# Patient Record
Sex: Female | Born: 1980 | Hispanic: No | Marital: Single | Smoking: Never smoker
Health system: Southern US, Community
[De-identification: ages and names within clinical notes are randomized; demographics above are authoritative.]

## PROBLEM LIST (undated history)

## (undated) ENCOUNTER — Inpatient Hospital Stay (HOSPITAL_COMMUNITY): Payer: Self-pay

## (undated) DIAGNOSIS — F32A Depression, unspecified: Secondary | ICD-10-CM

## (undated) DIAGNOSIS — R569 Unspecified convulsions: Secondary | ICD-10-CM

## (undated) DIAGNOSIS — O09219 Supervision of pregnancy with history of pre-term labor, unspecified trimester: Principal | ICD-10-CM

## (undated) DIAGNOSIS — F329 Major depressive disorder, single episode, unspecified: Secondary | ICD-10-CM

## (undated) DIAGNOSIS — Z789 Other specified health status: Secondary | ICD-10-CM

## (undated) DIAGNOSIS — D649 Anemia, unspecified: Secondary | ICD-10-CM

## (undated) HISTORY — DX: Major depressive disorder, single episode, unspecified: F32.9

## (undated) HISTORY — PX: BREAST SURGERY: SHX581

## (undated) HISTORY — DX: Depression, unspecified: F32.A

## (undated) HISTORY — DX: Supervision of pregnancy with history of pre-term labor, unspecified trimester: O09.219

---
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
## 1998-09-20 ENCOUNTER — Inpatient Hospital Stay (HOSPITAL_COMMUNITY): Admission: AD | Admit: 1998-09-20 | Discharge: 1998-09-20 | Payer: Self-pay | Admitting: Obstetrics & Gynecology

## 1998-09-20 ENCOUNTER — Encounter: Payer: Self-pay | Admitting: Obstetrics & Gynecology

## 1999-04-01 ENCOUNTER — Encounter (HOSPITAL_BASED_OUTPATIENT_CLINIC_OR_DEPARTMENT_OTHER): Payer: Self-pay | Admitting: General Surgery

## 1999-04-01 ENCOUNTER — Ambulatory Visit (HOSPITAL_COMMUNITY): Admission: RE | Admit: 1999-04-01 | Discharge: 1999-04-01 | Payer: Self-pay | Admitting: General Surgery

## 1999-05-12 ENCOUNTER — Ambulatory Visit (HOSPITAL_BASED_OUTPATIENT_CLINIC_OR_DEPARTMENT_OTHER): Admission: RE | Admit: 1999-05-12 | Discharge: 1999-05-12 | Payer: Self-pay | Admitting: General Surgery

## 1999-10-27 ENCOUNTER — Emergency Department (HOSPITAL_COMMUNITY): Admission: EM | Admit: 1999-10-27 | Discharge: 1999-10-27 | Payer: Self-pay | Admitting: Emergency Medicine

## 2000-02-03 ENCOUNTER — Ambulatory Visit (HOSPITAL_COMMUNITY): Admission: AD | Admit: 2000-02-03 | Discharge: 2000-02-03 | Payer: Self-pay | Admitting: Obstetrics

## 2000-11-16 ENCOUNTER — Emergency Department (HOSPITAL_COMMUNITY): Admission: EM | Admit: 2000-11-16 | Discharge: 2000-11-16 | Payer: Self-pay | Admitting: Emergency Medicine

## 2000-11-18 ENCOUNTER — Inpatient Hospital Stay (HOSPITAL_COMMUNITY): Admission: AD | Admit: 2000-11-18 | Discharge: 2000-11-18 | Payer: Self-pay | Admitting: Obstetrics and Gynecology

## 2000-11-25 ENCOUNTER — Inpatient Hospital Stay (HOSPITAL_COMMUNITY): Admission: AD | Admit: 2000-11-25 | Discharge: 2000-11-25 | Payer: Self-pay | Admitting: Obstetrics and Gynecology

## 2001-03-31 ENCOUNTER — Encounter: Admission: RE | Admit: 2001-03-31 | Discharge: 2001-03-31 | Payer: Self-pay | Admitting: Family Medicine

## 2001-04-21 ENCOUNTER — Encounter (INDEPENDENT_AMBULATORY_CARE_PROVIDER_SITE_OTHER): Payer: Self-pay | Admitting: *Deleted

## 2001-04-21 LAB — CONVERTED CEMR LAB

## 2001-05-03 ENCOUNTER — Inpatient Hospital Stay (HOSPITAL_COMMUNITY): Admission: RE | Admit: 2001-05-03 | Discharge: 2001-05-03 | Payer: Self-pay

## 2001-05-05 ENCOUNTER — Inpatient Hospital Stay (HOSPITAL_COMMUNITY): Admission: AD | Admit: 2001-05-05 | Discharge: 2001-05-05 | Payer: Self-pay | Admitting: *Deleted

## 2001-05-05 ENCOUNTER — Encounter: Admission: RE | Admit: 2001-05-05 | Discharge: 2001-05-05 | Payer: Self-pay | Admitting: Family Medicine

## 2001-05-23 ENCOUNTER — Encounter: Admission: RE | Admit: 2001-05-23 | Discharge: 2001-05-23 | Payer: Self-pay | Admitting: Family Medicine

## 2001-05-26 ENCOUNTER — Inpatient Hospital Stay (HOSPITAL_COMMUNITY): Admission: RE | Admit: 2001-05-26 | Discharge: 2001-05-26 | Payer: Self-pay | Admitting: Family Medicine

## 2001-06-02 ENCOUNTER — Encounter: Admission: RE | Admit: 2001-06-02 | Discharge: 2001-06-02 | Payer: Self-pay | Admitting: Family Medicine

## 2001-06-06 ENCOUNTER — Encounter: Admission: RE | Admit: 2001-06-06 | Discharge: 2001-06-06 | Payer: Self-pay | Admitting: Obstetrics & Gynecology

## 2001-06-22 ENCOUNTER — Encounter: Admission: RE | Admit: 2001-06-22 | Discharge: 2001-06-22 | Payer: Self-pay | Admitting: Obstetrics

## 2001-08-12 ENCOUNTER — Emergency Department (HOSPITAL_COMMUNITY): Admission: EM | Admit: 2001-08-12 | Discharge: 2001-08-12 | Payer: Self-pay | Admitting: Emergency Medicine

## 2001-09-29 ENCOUNTER — Inpatient Hospital Stay (HOSPITAL_COMMUNITY): Admission: AD | Admit: 2001-09-29 | Discharge: 2001-09-29 | Payer: Self-pay | Admitting: *Deleted

## 2001-10-02 ENCOUNTER — Encounter (INDEPENDENT_AMBULATORY_CARE_PROVIDER_SITE_OTHER): Payer: Self-pay

## 2001-10-02 ENCOUNTER — Inpatient Hospital Stay (HOSPITAL_COMMUNITY): Admission: AD | Admit: 2001-10-02 | Discharge: 2001-10-02 | Payer: Self-pay | Admitting: *Deleted

## 2001-10-02 ENCOUNTER — Ambulatory Visit (HOSPITAL_COMMUNITY): Admission: RE | Admit: 2001-10-02 | Discharge: 2001-10-02 | Payer: Self-pay | Admitting: Obstetrics and Gynecology

## 2002-06-06 ENCOUNTER — Inpatient Hospital Stay (HOSPITAL_COMMUNITY): Admission: AD | Admit: 2002-06-06 | Discharge: 2002-06-06 | Payer: Self-pay | Admitting: Obstetrics and Gynecology

## 2002-06-11 ENCOUNTER — Inpatient Hospital Stay (HOSPITAL_COMMUNITY): Admission: AD | Admit: 2002-06-11 | Discharge: 2002-06-11 | Payer: Self-pay | Admitting: *Deleted

## 2002-11-12 ENCOUNTER — Encounter: Payer: Self-pay | Admitting: Obstetrics and Gynecology

## 2002-11-12 ENCOUNTER — Inpatient Hospital Stay (HOSPITAL_COMMUNITY): Admission: AD | Admit: 2002-11-12 | Discharge: 2002-11-12 | Payer: Self-pay | Admitting: Obstetrics and Gynecology

## 2002-11-14 ENCOUNTER — Inpatient Hospital Stay (HOSPITAL_COMMUNITY): Admission: AD | Admit: 2002-11-14 | Discharge: 2002-11-14 | Payer: Self-pay | Admitting: Obstetrics and Gynecology

## 2002-12-05 ENCOUNTER — Other Ambulatory Visit: Admission: RE | Admit: 2002-12-05 | Discharge: 2002-12-05 | Payer: Self-pay | Admitting: *Deleted

## 2003-03-14 ENCOUNTER — Inpatient Hospital Stay (HOSPITAL_COMMUNITY): Admission: AD | Admit: 2003-03-14 | Discharge: 2003-03-14 | Payer: Self-pay | Admitting: Obstetrics & Gynecology

## 2003-03-31 ENCOUNTER — Inpatient Hospital Stay (HOSPITAL_COMMUNITY): Admission: AD | Admit: 2003-03-31 | Discharge: 2003-04-02 | Payer: Self-pay | Admitting: Family Medicine

## 2006-08-19 ENCOUNTER — Encounter (INDEPENDENT_AMBULATORY_CARE_PROVIDER_SITE_OTHER): Payer: Self-pay | Admitting: *Deleted

## 2006-10-21 ENCOUNTER — Encounter (INDEPENDENT_AMBULATORY_CARE_PROVIDER_SITE_OTHER): Payer: Self-pay | Admitting: Specialist

## 2006-10-21 ENCOUNTER — Emergency Department (HOSPITAL_COMMUNITY): Admission: EM | Admit: 2006-10-21 | Discharge: 2006-10-22 | Payer: Self-pay | Admitting: *Deleted

## 2007-11-23 ENCOUNTER — Encounter: Admission: RE | Admit: 2007-11-23 | Discharge: 2007-11-23 | Payer: Self-pay | Admitting: Family Medicine

## 2007-11-23 ENCOUNTER — Emergency Department (HOSPITAL_COMMUNITY): Admission: EM | Admit: 2007-11-23 | Discharge: 2007-11-23 | Payer: Self-pay | Admitting: Emergency Medicine

## 2008-07-15 ENCOUNTER — Inpatient Hospital Stay (HOSPITAL_COMMUNITY): Admission: AD | Admit: 2008-07-15 | Discharge: 2008-07-15 | Payer: Self-pay | Admitting: Obstetrics & Gynecology

## 2008-09-07 ENCOUNTER — Ambulatory Visit: Payer: Self-pay | Admitting: Obstetrics and Gynecology

## 2008-09-07 ENCOUNTER — Inpatient Hospital Stay (HOSPITAL_COMMUNITY): Admission: AD | Admit: 2008-09-07 | Discharge: 2008-09-19 | Payer: Self-pay | Admitting: Obstetrics

## 2008-09-17 ENCOUNTER — Encounter: Payer: Self-pay | Admitting: Obstetrics & Gynecology

## 2008-09-18 ENCOUNTER — Encounter: Payer: Self-pay | Admitting: Obstetrics and Gynecology

## 2008-11-08 ENCOUNTER — Inpatient Hospital Stay (HOSPITAL_COMMUNITY): Admission: AD | Admit: 2008-11-08 | Discharge: 2008-11-08 | Payer: Self-pay | Admitting: Obstetrics & Gynecology

## 2010-02-10 ENCOUNTER — Emergency Department (HOSPITAL_COMMUNITY): Admission: EM | Admit: 2010-02-10 | Discharge: 2010-02-10 | Payer: Self-pay | Admitting: Family Medicine

## 2010-09-29 LAB — WET PREP, GENITAL
Trich, Wet Prep: NONE SEEN
Yeast Wet Prep HPF POC: NONE SEEN

## 2010-09-29 LAB — GC/CHLAMYDIA PROBE AMP, GENITAL: Chlamydia, DNA Probe: NEGATIVE

## 2010-09-30 LAB — CBC
HCT: 27.8 % — ABNORMAL LOW (ref 36.0–46.0)
MCHC: 34.4 g/dL (ref 30.0–36.0)
MCV: 96.7 fL (ref 78.0–100.0)
Platelets: 261 10*3/uL (ref 150–400)
RDW: 12.9 % (ref 11.5–15.5)

## 2010-10-01 LAB — DIFFERENTIAL
Basophils Absolute: 0 10*3/uL (ref 0.0–0.1)
Basophils Absolute: 0.1 10*3/uL (ref 0.0–0.1)
Basophils Relative: 0 % (ref 0–1)
Basophils Relative: 0 % (ref 0–1)
Eosinophils Absolute: 0 10*3/uL (ref 0.0–0.7)
Lymphocytes Relative: 11 % — ABNORMAL LOW (ref 12–46)
Lymphocytes Relative: 13 % (ref 12–46)
Lymphs Abs: 1.4 10*3/uL (ref 0.7–4.0)
Lymphs Abs: 1.5 10*3/uL (ref 0.7–4.0)
Lymphs Abs: 1.6 10*3/uL (ref 0.7–4.0)
Monocytes Absolute: 0.6 10*3/uL (ref 0.1–1.0)
Monocytes Absolute: 1.1 10*3/uL — ABNORMAL HIGH (ref 0.1–1.0)
Monocytes Relative: 6 % (ref 3–12)
Monocytes Relative: 7 % (ref 3–12)
Neutro Abs: 11.8 10*3/uL — ABNORMAL HIGH (ref 1.7–7.7)
Neutro Abs: 15.6 10*3/uL — ABNORMAL HIGH (ref 1.7–7.7)
Neutro Abs: 15.8 10*3/uL — ABNORMAL HIGH (ref 1.7–7.7)
Neutro Abs: 8.8 10*3/uL — ABNORMAL HIGH (ref 1.7–7.7)
Neutrophils Relative %: 81 % — ABNORMAL HIGH (ref 43–77)
Neutrophils Relative %: 85 % — ABNORMAL HIGH (ref 43–77)
Neutrophils Relative %: 93 % — ABNORMAL HIGH (ref 43–77)

## 2010-10-01 LAB — URINALYSIS, ROUTINE W REFLEX MICROSCOPIC
Glucose, UA: NEGATIVE mg/dL
Glucose, UA: NEGATIVE mg/dL
Ketones, ur: NEGATIVE mg/dL
Leukocytes, UA: NEGATIVE
Specific Gravity, Urine: 1.01 (ref 1.005–1.030)
pH: 6 (ref 5.0–8.0)
pH: 6 (ref 5.0–8.0)

## 2010-10-01 LAB — URINE MICROSCOPIC-ADD ON

## 2010-10-01 LAB — GLUCOSE, CAPILLARY: Glucose-Capillary: 129 mg/dL — ABNORMAL HIGH (ref 70–99)

## 2010-10-01 LAB — GC/CHLAMYDIA PROBE AMP, URINE
Chlamydia, Swab/Urine, PCR: NEGATIVE
GC Probe Amp, Urine: NEGATIVE

## 2010-10-01 LAB — CBC
Hemoglobin: 10.8 g/dL — ABNORMAL LOW (ref 12.0–15.0)
Hemoglobin: 10.9 g/dL — ABNORMAL LOW (ref 12.0–15.0)
Hemoglobin: 11.2 g/dL — ABNORMAL LOW (ref 12.0–15.0)
MCHC: 34.5 g/dL (ref 30.0–36.0)
MCHC: 34.9 g/dL (ref 30.0–36.0)
MCV: 94.4 fL (ref 78.0–100.0)
Platelets: 233 10*3/uL (ref 150–400)
Platelets: 261 10*3/uL (ref 150–400)
Platelets: 294 10*3/uL (ref 150–400)
RBC: 3.27 MIL/uL — ABNORMAL LOW (ref 3.87–5.11)
RBC: 3.4 MIL/uL — ABNORMAL LOW (ref 3.87–5.11)
RDW: 12.9 % (ref 11.5–15.5)
RDW: 12.9 % (ref 11.5–15.5)
RDW: 13.3 % (ref 11.5–15.5)
RDW: 13.3 % (ref 11.5–15.5)
WBC: 11 10*3/uL — ABNORMAL HIGH (ref 4.0–10.5)
WBC: 11.8 10*3/uL — ABNORMAL HIGH (ref 4.0–10.5)
WBC: 14.7 10*3/uL — ABNORMAL HIGH (ref 4.0–10.5)
WBC: 18.4 10*3/uL — ABNORMAL HIGH (ref 4.0–10.5)

## 2010-10-01 LAB — RUBELLA SCREEN: Rubella: 142.9 IU/mL — ABNORMAL HIGH

## 2010-10-01 LAB — CULTURE, ROUTINE-GENITAL: Culture: NORMAL

## 2010-10-01 LAB — URINE CULTURE

## 2010-10-01 LAB — STREP B DNA PROBE

## 2010-10-01 LAB — RPR: RPR Ser Ql: NONREACTIVE

## 2010-10-01 LAB — HEPATITIS B SURFACE ANTIGEN: Hepatitis B Surface Ag: NEGATIVE

## 2010-10-05 LAB — URINALYSIS, ROUTINE W REFLEX MICROSCOPIC
Bilirubin Urine: NEGATIVE
Hgb urine dipstick: NEGATIVE
Ketones, ur: NEGATIVE mg/dL
Nitrite: NEGATIVE
Protein, ur: NEGATIVE mg/dL
Specific Gravity, Urine: 1.025 (ref 1.005–1.030)
Urobilinogen, UA: 0.2 mg/dL (ref 0.0–1.0)

## 2010-10-05 LAB — WET PREP, GENITAL: Clue Cells Wet Prep HPF POC: NONE SEEN

## 2010-10-05 LAB — GC/CHLAMYDIA PROBE AMP, GENITAL: Chlamydia, DNA Probe: NEGATIVE

## 2010-11-03 NOTE — Discharge Summary (Signed)
NAMEMELISSE, Veronica Mccormick NO.:  [id number]   MEDICAL RECORD NO.:  [id number]         PATIENT TYPE:  MAT   LOCATION:  MATC                          FACILITY:  [facility]   PHYSICIAN:  Scheryl Darter, MD       DATE OF BIRTH:  May 11, 1981   DATE OF ADMISSION:  07/15/2008  DATE OF DISCHARGE:  07/15/2008                               DISCHARGE SUMMARY   ADMITTING DIAGNOSIS:  Preterm premature rupture of membranes at 22  weeks.   DISCHARGE DIAGNOSES:  1. Preterm delivery at 23 weeks 2 days.  2. Neonatal demise on the second day of life.   HOSPITAL COURSE:  Ms. Veronica Mccormick is a gravida 80, para 3-1-5-4 who  presented on September 07, 2008 with complaints of leaking amniotic fluid.  She has been receiving care from Dr. Elsie Stain office and has not had  any problems up until that day.  She was noted to be ruptured and 2-3 cm  dilated.  Options were discussed with her and she chose not to do  anything to end the pregnancy.  She was put on IV antibiotics for  several days were converted to p.o. antibiotics.  She received 2 doses  of betamethasone at 22 weeks and 5 days.  On March 31, she was noted to  have a climbing temperature with elevated white count.  She was  diagnosed with a urinary tract infection.  Chorioamnionitis was most  likely ruled out due to no abdominal tenderness.  She was placed on IV  Rocephin.  Shortly after that, she went into labor and was transferred  to labor and delivery.  She quickly delivered a viable female infant.  She was taken to the NICU for care.  The baby died at 23 hours of life.  The patient's postpartum course has been uncomplicated.  She had a  manual extraction of her placenta.  Estimated blood loss was within  normal limits.  Birth control options were discussed with the patient.  She was planned to have an IUD at 6 weeks postpartum and pelvic rest  until then.   DISCHARGE LABS:  Hemoglobin 9.6, hematocrit 27.8, platelets are 261.  White  count of 16.9 which is down from 18.4.  She was treated with IV  antibiotics for a urinary tract infection.  We will continue with p.o.  antibiotics for the next week.  She is going to follow up with [facility] at 4 weeks' gestation.      Veronica Mccormick, C.N.M.      Scheryl Darter, MD  Electronically Signed    FC/MEDQ  D:  09/19/2008  T:  09/20/2008  Job:  [id number]

## 2010-11-03 NOTE — Op Note (Signed)
NAMEMCKENZEE, BEEM      ACCOUNT NO.:  [id number]   MEDICAL RECORD NO.:  [id number]          PATIENT TYPE:  OUT   LOCATION:  MFM                           [facility]  [facility]   PHYSICIAN:  Tilda Burrow, M.D. DATE OF BIRTH:  [birth year]   DATE OF PROCEDURE:  09/18/2008  DATE OF DISCHARGE:                               OPERATIVE REPORT   DELIVERY NOTE   I was called over the phone to identify increase cramping and discomfort  in Ms. Rivera-Salazar.  Labor and delivery was notified and as I arrived  to antenatal transfer was being promptly performed to take her to Labor  and Delivery.  Upon transfer to the room #166 in Labor and Delivery, the  exam showed a generous amount of blood at the vaginal entrance, and  vaginal exam shows fetal small parts just inside the introitus with a  fetal arm identified as the presenting part with the vertex flexed  inside.  The patient was quickly prepped and draped for vaginal delivery  and the baby expelled onto a sterile field in a controlled fashion.  The  perineum was then abducted to allow the vertex to be delivered first  from an OP position, spontaneous prompt expulsion of the entire fetal  body was followed by clamping of the cord.  Brief limb and jaw motion  was noted.  The baby was transferred to _the infant___ warmer with  pediatrics present within 30 seconds.  See their notes regarding the  baby's subsequent resuscitation efforts.  The placenta was delivered  intact in approximately 5 minutes in response to massage and IV Pitocin.  The placenta itself appeared intact with the fetal surface, which is  appearing thickened and cloudy in appearance.  There was only small  amount of membranes attached to the placental edges.  Inspection of the  cervix with the speculum revealed no visible membranes.  In order to  ensure there was no residual tissue inside the uterus or any residual  membranes, a paracervical block and intracervical block  was applied  allowing the use of ring forceps to gently probe the uterine cavity with  the support of an additional 1 mg of Stadol.  No membranes were found in  the uterine cavity.  The large amount of blood clots did expel  immediately with the delivery of the baby.  We inspected and there was a  small amount of fibrinous discolored debris suggestive of autolyzed  membrane tissue.  It is my opinion that there was no residual placental  tissue or membrane tissue left behind.  Estimated blood loss after  delivery was entirely within normal limits.  Fundus was firm at U -4.  We will give her 1 g of Rocephin to complete at 8 p.m., due to the  uterine manipulation and follow her routine postpartum period.  The  infant was a small female infant at 410 g and Apgars were assigned by  Pediatrics.      Tilda Burrow, M.D.  Electronically Signed     JVF/MEDQ  D:  09/18/2008  T:  09/19/2008  Job:  [id number]

## 2010-11-06 NOTE — Op Note (Signed)
[facility]  Patient:    Veronica Mccormick, Veronica Mccormick Visit Number: [id number] MRN: [id number]          Service Type: DSU Location: [facility] Attending Physician:  Amada Kingfisher. Dictated by:   Elinor Dodge, M.D. Proc. Date: 10/02/01 Admit Date:  10/02/2001                             Operative Report  PREOPERATIVE DIAGNOSES:       1. Missed abortion.                               2. 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  POSTOPERATIVE DIAGNOSIS:      Pending pathology.  PROCEDURE:                    Dilatation and suction curettage.  SURGEON:                      Elinor Dodge, M.D.  DESCRIPTION OF PROCEDURE:     Under satisfactory IV sedation with the patient in the dorsal lithotomy position, bimanual pelvic examination revealed the uterus anterior, approximately [redacted] weeks gestational size and freely moveable. a weighted speculum was placed in the posterior fourchette of the vagina through a marital introitus.  BUS were within normal limits.  The vagina was clean and well rugated.  The anterior lip of the cervix was grasped with a single-tooth tenaculum.  A total of 5 cc of 1% Xylocaine was injected in each of the paracervical areas at the apex of the vagina at 4 and 8 oclock.  The cervix was then dilated up to a #10 Hegar dilator after the uterine cavity had been sounded to a depth of 14 cm.  The uterine contents were easily emptied with a suction curet.  There was no significant bleeding.  The tenaculum and speculum were removed from the vagina.  The patient was transferred to the recovery room in satisfactory condition with minimal blood loss. Dictated by:   Elinor Dodge, M.D. Attending Physician:  Amada Kingfisher. DD:  10/02/01 TD:  10/02/01 Job: [id number]

## 2011-03-18 LAB — POCT PREGNANCY, URINE
Operator id: 247071
Preg Test, Ur: NEGATIVE

## 2011-03-18 LAB — POCT URINALYSIS DIP (DEVICE)
Glucose, UA: NEGATIVE
Nitrite: NEGATIVE
Operator id: 247071
Protein, ur: 100 — AB
Specific Gravity, Urine: >=1.03
Urobilinogen, UA: 0.2

## 2011-03-18 LAB — URINE CULTURE

## 2011-05-10 ENCOUNTER — Inpatient Hospital Stay (HOSPITAL_COMMUNITY)
Admission: AD | Admit: 2011-05-10 | Discharge: 2011-05-10 | Disposition: A | Discharge status: Home / Self Care | Payer: Self-pay | Source: Ambulatory Visit | Attending: Obstetrics & Gynecology | Admitting: Obstetrics & Gynecology

## 2011-05-10 ENCOUNTER — Emergency Department (INDEPENDENT_AMBULATORY_CARE_PROVIDER_SITE_OTHER)
Admission: EM | Admit: 2011-05-10 | Discharge: 2011-05-10 | Disposition: A | Discharge status: Home / Self Care | Payer: Self-pay | Source: Home / Self Care | Attending: Emergency Medicine | Admitting: Emergency Medicine

## 2011-05-10 ENCOUNTER — Inpatient Hospital Stay (HOSPITAL_COMMUNITY): Payer: Self-pay

## 2011-05-10 DIAGNOSIS — R102 Pelvic and perineal pain: Secondary | ICD-10-CM

## 2011-05-10 DIAGNOSIS — N949 Unspecified condition associated with female genital organs and menstrual cycle: Secondary | ICD-10-CM

## 2011-05-10 DIAGNOSIS — R109 Unspecified abdominal pain: Secondary | ICD-10-CM

## 2011-05-10 DIAGNOSIS — R1031 Right lower quadrant pain: Secondary | ICD-10-CM | POA: Insufficient documentation

## 2011-05-10 LAB — POCT URINALYSIS DIP (DEVICE)
Ketones, ur: NEGATIVE mg/dL
Protein, ur: NEGATIVE mg/dL
Specific Gravity, Urine: 1.02 (ref 1.005–1.030)
Urobilinogen, UA: 0.2 mg/dL (ref 0.0–1.0)

## 2011-05-10 LAB — DIFFERENTIAL
Basophils Absolute: 0 10*3/uL (ref 0.0–0.1)
Eosinophils Absolute: 0.1 10*3/uL (ref 0.0–0.7)
Eosinophils Relative: 1 % (ref 0–5)
Lymphocytes Relative: 28 % (ref 12–46)
Neutrophils Relative %: 63 % (ref 43–77)

## 2011-05-10 LAB — WET PREP, GENITAL
Clue Cells Wet Prep HPF POC: NONE SEEN
Trich, Wet Prep: NONE SEEN
Yeast Wet Prep HPF POC: NONE SEEN

## 2011-05-10 LAB — POCT PREGNANCY, URINE: Preg Test, Ur: NEGATIVE

## 2011-05-10 LAB — CBC
MCH: 31.7 pg (ref 26.0–34.0)
MCV: 92.4 fL (ref 78.0–100.0)
Platelets: 360 10*3/uL (ref 150–400)
RDW: 12.1 % (ref 11.5–15.5)
WBC: 7.3 10*3/uL (ref 4.0–10.5)

## 2011-05-10 NOTE — Progress Notes (Signed)
Patient states she has had right lower abdominal pain for about 2 months, is worse with movement. Was seen at [facility] today and sent to MAU for further evaluation.

## 2011-05-10 NOTE — ED Provider Notes (Signed)
History     CSN: [id number] Arrival date & time: 05/10/2011  3:11 PM   First MD Initiated Contact with Patient 05/10/11 1558      Chief Complaint  Patient presents with  . Abdominal Pain    (Consider location/radiation/quality/duration/timing/severity/associated sxs/prior treatment) Patient is a 30 y.o. female presenting with abdominal pain. The history is provided by the patient.  Abdominal Pain The primary symptoms of the illness include abdominal pain and vaginal bleeding (finishing normal menses). The primary symptoms of the illness do not include fever, shortness of breath, nausea, vomiting, diarrhea, dysuria or vaginal discharge. Episode onset: Pain began 2 mos ago RLQ of abd. Pain initially was intermittent. Noticed primarily with certain movements and activities.  In last 2 weeks pain has been little more persistent. Since yesterday has been constant, sharp. The onset of the illness was gradual. The problem has been rapidly worsening.  The abdominal pain is located in the RLQ. The abdominal pain does not radiate.  Symptoms associated with the illness do not include chills, constipation, urgency or frequency.  Normal menses in last week - began on time. No birth control.   History reviewed. No pertinent past medical history.  Past Surgical History  Procedure Date  . Breast surgery     History reviewed. No pertinent family history.  History  Substance Use Topics  . Smoking status: Never Smoker   . Smokeless tobacco: Not on file  . Alcohol Use: No    OB History    Grav Para Term Preterm Abortions TAB SAB Ect Mult Living                  Review of Systems  Constitutional: Negative for fever and chills.  Respiratory: Negative for shortness of breath.   Cardiovascular: Negative for chest pain.  Gastrointestinal: Positive for abdominal pain. Negative for nausea, vomiting, diarrhea and constipation.  Genitourinary: Positive for vaginal bleeding (finishing normal  menses). Negative for dysuria, urgency, frequency, decreased urine volume, vaginal discharge, difficulty urinating and vaginal pain.    Allergies  Review of patient's allergies indicates no known allergies.  Home Medications   Current Outpatient Rx  Name Route Sig Dispense Refill  . HYDROCODONE-ACETAMINOPHEN PO Oral Take by mouth.        BP 122/76  Pulse 76  Temp(Src) 98.2 F (36.8 C) (Oral)  Resp 16  SpO2 100%  LMP 05/09/2011  Physical Exam  Nursing note and vitals reviewed. Constitutional: She appears well-developed and well-nourished. No distress.  Cardiovascular: Normal rate, regular rhythm and normal heart sounds.   Pulmonary/Chest: Effort normal and breath sounds normal. No respiratory distress.  Abdominal: Soft. Bowel sounds are normal. She exhibits no mass. There is hepatosplenomegaly. There is tenderness (mild) in the right lower quadrant. There is no rigidity, no rebound, no guarding, no CVA tenderness and no tenderness at McBurney's point. No hernia.  Genitourinary: Vagina normal and uterus normal. There is no rash, tenderness or lesion on the right labia. There is no rash, tenderness or lesion on the left labia. Cervix exhibits no motion tenderness, no discharge and no friability. Right adnexum displays tenderness. Right adnexum displays no mass and no fullness. Left adnexum displays no mass, no tenderness and no fullness.    ED Course  Procedures (including critical care time)  Labs Reviewed  POCT URINALYSIS DIP (DEVICE) - Abnormal; Notable for the following:    Hgb urine dipstick SMALL (*)    All other components within normal limits  POCT PREGNANCY, URINE  POCT  URINALYSIS DIPSTICK  POCT PREGNANCY, URINE  GC/CHLAMYDIA PROBE AMP, GENITAL  WET PREP, GENITAL   No results found.   1. Adnexal pain       MDM  Rt adnexal pain on pelvic exam. Due to pain x 2 mos, with increased severity x 2 days pt advised to go directly to [facility] ED for further eval.  Eve at [facility] ED was notified.        Melody Comas, [place] 05/10/11 [phone]

## 2011-05-10 NOTE — ED Notes (Signed)
Report called to [facility] RN at [facility] ED triage.  Esperanza Sheets PA called report to EVE.  Pt to be discharged and go straight to [facility] via private vehicle.  Pt instructed not to eat or drink anything.

## 2011-05-10 NOTE — ED Provider Notes (Signed)
Medical screening examination/treatment/procedure(s) were performed by non-physician practitioner and as supervising physician I was immediately available for consultation/collaboration.  Raynald Blend, MD 05/10/11 1659

## 2011-05-10 NOTE — ED Notes (Signed)
C/o RLQ abdominal pain for 2 months.  States it initially hurt only with lifting or movement.  For last 2 weeks hurts a little all the time and then is much worse with activity.  Denies vaginal discharge or urinary sx.

## 2011-05-10 NOTE — ED Provider Notes (Signed)
History     Chief Complaint  Patient presents with  . Abdominal Pain   HPI Veronica Mccormick [name] y.o. presents after having an evaluation at [facility] for continued evaluation of RLQ pain.  Pain is reported as Intermittent. States she feels more discomfort when she moves certain ways, mopping and sweeping.  Denies nausea and vomiting.   She is at the end of her cycle. Is not using contraception.   States she has had discomfort for 2 months.  Her UA and UPT at [facility] were negative.       No past medical history on file.  Past Surgical History  Procedure Date  . Breast surgery     No family history on file.  History  Substance Use Topics  . Smoking status: Never Smoker   . Smokeless tobacco: Not on file  . Alcohol Use: No    Allergies: No Known Allergies  Prescriptions prior to admission  Medication Sig Dispense Refill  . HYDROCODONE-ACETAMINOPHEN PO Take by mouth.          ROS Physical Exam   Blood pressure 126/69, pulse 76, temperature 98.5 F (36.9 C), temperature source Oral, resp. rate 16, height 5\' 1"  (1.549 m), weight 134 lb 9.6 oz (61.054 kg), last menstrual period 05/06/2011, SpO2 98.00%.  Physical Exam  Constitutional: She is oriented to person, place, and time. She appears well-developed and well-nourished.  HENT:  Head: Normocephalic.  Neck: Normal range of motion.  Cardiovascular: Normal rate.   Respiratory: Effort normal.  GI:       Not repeated   Genitourinary:       Not repeated  Neurological: She is alert and oriented to person, place, and time.  Skin: Skin is warm and dry.   Results for orders placed during the hospital encounter of 05/10/11 (from the past 24 hour(s))  CBC     Status: Normal   Collection Time   05/10/11  6:47 PM      Component Value Range   WBC 7.3  4.0 - 10.5 (K/uL)   RBC 4.19  3.87 - 5.11 (MIL/uL)   Hemoglobin 13.3  12.0 - 15.0 (g/dL)   HCT 47.8  29.5 - 62.1 (%)   MCV 92.4  78.0 - 100.0 (fL)   MCH 31.7  26.0 - 34.0 (pg)   MCHC  34.4  30.0 - 36.0 (g/dL)   RDW 30.8  65.7 - 84.6 (%)   Platelets 360  150 - 400 (K/uL)  DIFFERENTIAL     Status: Normal   Collection Time   05/10/11  6:47 PM      Component Value Range   Neutrophils Relative 63  43 - 77 (%)   Neutro Abs 4.6  1.7 - 7.7 (K/uL)   Lymphocytes Relative 28  12 - 46 (%)   Lymphs Abs 2.0  0.7 - 4.0 (K/uL)   Monocytes Relative 7  3 - 12 (%)   Monocytes Absolute 0.5  0.1 - 1.0 (K/uL)   Eosinophils Relative 1  0 - 5 (%)   Eosinophils Absolute 0.1  0.0 - 0.7 (K/uL)   Basophils Relative 0  0 - 1 (%)   Basophils Absolute 0.0  0.0 - 0.1 (K/uL)   US Transvaginal Non-OB Status:  Final result                     Study Result     *RADIOLOGY REPORT*  Clinical Data: Pelvic pain  TRANSABDOMINAL AND TRANSVAGINAL ULTRASOUND OF PELVIS  Technique: Both transabdominal and transvaginal ultrasound  examinations of the pelvis were performed. Transabdominal technique  was performed for global imaging of the pelvis including uterus,  ovaries, adnexal regions, and pelvic cul-de-sac.  Comparison: None.  It was necessary to proceed with endovaginal exam following the  transabdominal exam to visualize the ovaries.  Findings:  Uterus: 7.3 x 3.0 x 3.0 cm. Homogeneous myometrial echotexture.  Endometrium: Normal in appearance. 4 mm in thickness.  Right ovary: 2.3 x 1.6 x 1.6 cm. Multiple follicles noted.  Left ovary: 2.0 x 1.2 x 0.8 cm. Sonographically normal.  Other findings: No free fluid  IMPRESSION:  Unremarkable pelvic ultrasound.  Original Report Authenticated By: ERIC A. MANSELL, M.D.      Imaging      MAU Course  Procedures  MDM Explained ultrasound and lab results to the patient at 19:55.  She states her pain is less than at the time of arrival.    Assessment and Plan  A; Abdominal Pain unknown etiology  P: Explained U/S and lab results and explained there are no apparent signs of a gynecologic condition causing her discomfort.  Explained the  UC will call her if cultures are positive.   If abdominal pain worsens, she will need further evaluation.  Carlene Bickley,EVE M 05/10/2011, 7:13 PM   Matt Holmes, NP 05/10/11 1956

## 2013-03-14 ENCOUNTER — Inpatient Hospital Stay (HOSPITAL_COMMUNITY)
Admission: AD | Admit: 2013-03-14 | Discharge: 2013-03-14 | Disposition: A | Discharge status: Home / Self Care | Payer: Self-pay | Source: Ambulatory Visit | Attending: Obstetrics & Gynecology | Admitting: Obstetrics & Gynecology

## 2013-03-14 ENCOUNTER — Inpatient Hospital Stay (HOSPITAL_COMMUNITY): Payer: Medicaid Other

## 2013-03-14 ENCOUNTER — Encounter (HOSPITAL_COMMUNITY): Payer: Self-pay | Admitting: *Deleted

## 2013-03-14 DIAGNOSIS — O4690 Antepartum hemorrhage, unspecified, unspecified trimester: Secondary | ICD-10-CM

## 2013-03-14 DIAGNOSIS — O209 Hemorrhage in early pregnancy, unspecified: Secondary | ICD-10-CM | POA: Insufficient documentation

## 2013-03-14 DIAGNOSIS — Z3490 Encounter for supervision of normal pregnancy, unspecified, unspecified trimester: Secondary | ICD-10-CM

## 2013-03-14 HISTORY — DX: Other specified health status: Z78.9

## 2013-03-14 LAB — OB RESULTS CONSOLE GC/CHLAMYDIA
Chlamydia: NEGATIVE
Gonorrhea: NEGATIVE

## 2013-03-14 LAB — POCT PREGNANCY, URINE: Preg Test, Ur: POSITIVE — AB

## 2013-03-14 LAB — WET PREP, GENITAL: Clue Cells Wet Prep HPF POC: NONE SEEN

## 2013-03-14 NOTE — MAU Note (Signed)
Patient complains of small vaginal bleeding and passed a very small clot. She has no pain and is [redacted] weeks pregnant.

## 2013-03-14 NOTE — MAU Note (Signed)
Positive test at health dept, has paperwork with her

## 2013-03-14 NOTE — MAU Note (Signed)
Says she is 8 wks preg. Passed a clot and some bloody mucous about an hour ago.Marland Kitchen No pain.

## 2013-03-14 NOTE — MAU Provider Note (Signed)
History     CSN: [id number]  Arrival date and time: 03/14/13 1658   First Provider Initiated Contact with Patient 03/14/13 1751      Chief Complaint  Patient presents with  . Vaginal Bleeding  . Possible Pregnancy   HPI Veronica Mccormick is 32 y.o. [phone] presents with vaginal bleeding in pregnancy, denies pain, occ cramping.   LMP 01/15/13.  Went to [facility] for pregnancy test, has appt in 2 weeks to be seen by provider.  She was told she would be coming to [facility] because she had PROM, hospitalized X 1 month then preterm delivery at 6 months  Baby lived 1 day.  1 sexual partner-husband.  Patient had intercourse last night.   Past Medical History  Diagnosis Date  . Medical history non-contributory     Past Surgical History  Procedure Laterality Date  . Breast surgery      Family History  Problem Relation Age of Onset  . Alcohol abuse Neg Hx   . Arthritis Neg Hx   . Asthma Neg Hx   . Birth defects Neg Hx   . Cancer Neg Hx   . COPD Neg Hx   . Depression Neg Hx   . Diabetes Neg Hx   . Drug abuse Neg Hx   . Early death Neg Hx   . Hearing loss Neg Hx   . Heart disease Neg Hx   . Hypertension Neg Hx   . Hyperlipidemia Neg Hx   . Kidney disease Neg Hx   . Learning disabilities Neg Hx   . Mental illness Neg Hx   . Mental retardation Neg Hx   . Miscarriages / Stillbirths Neg Hx   . Stroke Neg Hx   . Vision loss Neg Hx     History  Substance Use Topics  . Smoking status: Never Smoker   . Smokeless tobacco: Not on file  . Alcohol Use: No    Allergies: No Known Allergies  Prescriptions prior to admission  Medication Sig Dispense Refill  . Prenatal Vit-Fe Fumarate-FA (PRENATAL MULTIVITAMIN) TABS tablet Take 1 tablet by mouth daily at 12 noon.        Review of Systems  Constitutional: Negative for fever and chills.  Gastrointestinal: Positive for abdominal pain (intermittent mild cramping). Negative for nausea and vomiting.  Genitourinary: Negative for dysuria,  urgency, frequency and hematuria.       + for vaginal bleeding  Neurological: Negative for headaches.   Physical Exam   Blood pressure 120/74, pulse 91, temperature 98.1 F (36.7 C), temperature source Oral, resp. rate 18, height 5\' 1"  (1.549 m), weight 147 lb (66.679 kg), last menstrual period 01/15/2013.  Physical Exam  Constitutional: She is oriented to person, place, and time. She appears well-developed and well-nourished. No distress.  HENT:  Head: Normocephalic.  Neck: Normal range of motion.  Cardiovascular: Normal rate.   Respiratory: Effort normal.  GI: Soft. She exhibits no distension and no mass. There is no tenderness. There is no rebound and no guarding.  Genitourinary: There is no rash, tenderness or lesion on the right labia. There is no rash, tenderness or lesion on the left labia. Uterus is enlarged. Uterus is not tender. Cervix exhibits no motion tenderness, no discharge and no friability. Right adnexum displays no mass, no tenderness and no fullness. Left adnexum displays no mass, no tenderness and no fullness. Bleeding: scant amount of streaks of blood, no clot or active bleeding seen. Vaginal discharge found.  Neurological: She is alert  and oriented to person, place, and time.  Skin: Skin is warm and dry.  Psychiatric: She has a normal mood and affect. Her behavior is normal.   Results for orders placed during the hospital encounter of 03/14/13 (from the past 24 hour(s))  WET PREP, GENITAL     Status: Abnormal   Collection Time    03/14/13  6:05 PM      Result Value Range   Yeast Wet Prep HPF POC NONE SEEN  NONE SEEN   Trich, Wet Prep NONE SEEN  NONE SEEN   Clue Cells Wet Prep HPF POC NONE SEEN  NONE SEEN   WBC, Wet Prep HPF POC FEW (*) NONE SEEN  POCT PREGNANCY, URINE     Status: Abnormal   Collection Time    03/14/13  6:14 PM      Result Value Range   Preg Test, Ur POSITIVE (*) NEGATIVE   BLOOD TYPE from previous record-- A POS  US Ob Comp Less 14  Wks  03/14/2013   CLINICAL DATA:  Bleeding.  EXAM: OBSTETRIC <14 WK ULTRASOUND  TECHNIQUE: Transabdominal ultrasound was performed for evaluation of the gestation as well as the maternal uterus and adnexal regions.  COMPARISON:  None  FINDINGS: Intrauterine gestational sac: Single intrauterine gestational sac. Normal in shape.  Yolk sac:  Present  Embryo:  Present  Cardiac Activity: Present  Heart Rate: 174 bpm  CRL:   2.33 cm     w 9 d 1     [place] EDC: 10/16/2013  Maternal uterus/adnexae:  Subchorionic hemorrhage: None  Right ovary: Not visualized  Left ovary: Not visualized  Other :None  Free fluid: None  IMPRESSION: 1. Single living intrauterine gestation.  2. Estimated gestational age of [redacted] weeks and 1 day.   Electronically Signed   By: Signa Kell M.D.   On: 03/14/2013 20:10   MAU Course  Procedures  GC/CHL culture to lab  MDM Assessment and Plan  A:  Single living IUP [redacted]w[redacted]d gestation      Bleeding in first trimester pregnancy  P:  Pelvic rest until blood no longer seen      Begin prenatal care with doctor of your choice.  Faraz Ponciano,EVE M 03/14/2013, 8:33 PM

## 2013-03-15 LAB — GC/CHLAMYDIA PROBE AMP: GC Probe RNA: NEGATIVE

## 2013-03-24 ENCOUNTER — Inpatient Hospital Stay (HOSPITAL_COMMUNITY): Payer: Medicaid Other

## 2013-03-24 ENCOUNTER — Inpatient Hospital Stay (HOSPITAL_COMMUNITY)
Admission: AD | Admit: 2013-03-24 | Discharge: 2013-03-24 | Disposition: A | Discharge status: Home / Self Care | Payer: Self-pay | Source: Ambulatory Visit | Attending: Obstetrics & Gynecology | Admitting: Obstetrics & Gynecology

## 2013-03-24 ENCOUNTER — Encounter (HOSPITAL_COMMUNITY): Payer: Self-pay | Admitting: *Deleted

## 2013-03-24 DIAGNOSIS — O209 Hemorrhage in early pregnancy, unspecified: Secondary | ICD-10-CM | POA: Insufficient documentation

## 2013-03-24 DIAGNOSIS — O4690 Antepartum hemorrhage, unspecified, unspecified trimester: Secondary | ICD-10-CM

## 2013-03-24 LAB — CBC
HCT: 34 % — ABNORMAL LOW (ref 36.0–46.0)
Hemoglobin: 11.8 g/dL — ABNORMAL LOW (ref 12.0–15.0)
MCHC: 34.7 g/dL (ref 30.0–36.0)
RBC: 3.72 MIL/uL — ABNORMAL LOW (ref 3.87–5.11)

## 2013-03-24 LAB — URINALYSIS, ROUTINE W REFLEX MICROSCOPIC
Bilirubin Urine: NEGATIVE
Ketones, ur: 15 mg/dL — AB
Nitrite: NEGATIVE
pH: 6 (ref 5.0–8.0)

## 2013-03-24 LAB — URINE MICROSCOPIC-ADD ON

## 2013-03-24 NOTE — MAU Provider Note (Signed)
History     CSN: [id number]  Arrival date and time: 03/24/13 1623   First Provider Initiated Contact with Patient 03/24/13 1712      Chief Complaint  Patient presents with  . Vaginal Bleeding   HPI Ms. Veronica Mccormick is a 32 y.o. [id number] at [redacted]w[redacted]d who presents to MAU today with complaint of vaginal bleeding x 1-2 hours. The patient states that it is heavier than her normal period. She denies abdominal pain, fever, dizziness, weakness, UTI symptoms, N/V/D or constipation. The patient denies recent exam or intercourse.   OB History   Grav Para Term Preterm Abortions TAB SAB Ect Mult Living   7    1  1   3       Past Medical History  Diagnosis Date  . Medical history non-contributory     Past Surgical History  Procedure Laterality Date  . Breast surgery      Family History  Problem Relation Age of Onset  . Alcohol abuse Neg Hx   . Arthritis Neg Hx   . Asthma Neg Hx   . Birth defects Neg Hx   . Cancer Neg Hx   . COPD Neg Hx   . Depression Neg Hx   . Diabetes Neg Hx   . Drug abuse Neg Hx   . Early death Neg Hx   . Hearing loss Neg Hx   . Heart disease Neg Hx   . Hypertension Neg Hx   . Hyperlipidemia Neg Hx   . Kidney disease Neg Hx   . Learning disabilities Neg Hx   . Mental illness Neg Hx   . Mental retardation Neg Hx   . Miscarriages / Stillbirths Neg Hx   . Stroke Neg Hx   . Vision loss Neg Hx     History  Substance Use Topics  . Smoking status: Never Smoker   . Smokeless tobacco: Not on file  . Alcohol Use: No    Allergies: No Known Allergies  Prescriptions prior to admission  Medication Sig Dispense Refill  . Prenatal Vit-Fe Fumarate-FA (PRENATAL MULTIVITAMIN) TABS tablet Take 1 tablet by mouth daily at 12 noon.        Review of Systems  Constitutional: Negative for fever and malaise/fatigue.  Gastrointestinal: Negative for nausea, vomiting, abdominal pain, diarrhea and constipation.  Genitourinary: Negative for dysuria, urgency and  frequency.       Neg - vaginal discharge + vaginal bleeding  Neurological: Negative for dizziness, loss of consciousness and weakness.   Physical Exam   Blood pressure 129/73, pulse 85, temperature 97.8 F (36.6 C), resp. rate 18, height 5\' 1"  (1.549 m), weight 146 lb (66.225 kg), last menstrual period 01/15/2013.  Physical Exam  Constitutional: She is oriented to person, place, and time. She appears well-developed and well-nourished. No distress.  HENT:  Head: Normocephalic and atraumatic.  Cardiovascular: Normal rate, regular rhythm and normal heart sounds.   Respiratory: Effort normal and breath sounds normal. No respiratory distress.  GI: Soft. Bowel sounds are normal. She exhibits no distension and no mass. There is no tenderness. There is no rebound and no guarding.  Genitourinary: Uterus is enlarged (appropriate for GA) and tender (mild tenderness to palpation). Cervix exhibits no motion tenderness, no discharge and no friability. Right adnexum displays no mass and no tenderness. Left adnexum displays no mass and no tenderness. There is bleeding (small amount of bleeding and 2 small clots noted in the vagina) around the vagina. No vaginal discharge found.  Neurological:  She is alert and oriented to person, place, and time.  Skin: Skin is warm and dry. No erythema.  Psychiatric: She has a normal mood and affect.   Results for orders placed during the hospital encounter of 03/24/13 (from the past 24 hour(s))  URINALYSIS, ROUTINE W REFLEX MICROSCOPIC     Status: Abnormal   Collection Time    03/24/13  4:30 PM      Result Value Range   Color, Urine YELLOW  YELLOW   APPearance CLEAR  CLEAR   Specific Gravity, Urine 1.025  1.005 - 1.030   pH 6.0  5.0 - 8.0   Glucose, UA NEGATIVE  NEGATIVE mg/dL   Hgb urine dipstick LARGE (*) NEGATIVE   Bilirubin Urine NEGATIVE  NEGATIVE   Ketones, ur 15 (*) NEGATIVE mg/dL   Protein, ur NEGATIVE  NEGATIVE mg/dL   Urobilinogen, UA 0.2  0.0 - 1.0  mg/dL   Nitrite NEGATIVE  NEGATIVE   Leukocytes, UA NEGATIVE  NEGATIVE  URINE MICROSCOPIC-ADD ON     Status: Abnormal   Collection Time    03/24/13  4:30 PM      Result Value Range   Squamous Epithelial / LPF FEW (*) RARE   RBC / HPF TOO NUMEROUS TO COUNT  <3 RBC/hpf   Bacteria, UA RARE  RARE  CBC     Status: Abnormal   Collection Time    03/24/13  5:51 PM      Result Value Range   WBC 9.7  4.0 - 10.5 K/uL   RBC 3.72 (*) 3.87 - 5.11 MIL/uL   Hemoglobin 11.8 (*) 12.0 - 15.0 g/dL   HCT 16.1 (*) 09.6 - 04.5 %   MCV 91.4  78.0 - 100.0 fL   MCH 31.7  26.0 - 34.0 pg   MCHC 34.7  30.0 - 36.0 g/dL   RDW 40.9  81.1 - 91.4 %   Platelets 301  150 - 400 K/uL  HCG, QUANTITATIVE, PREGNANCY     Status: Abnormal   Collection Time    03/24/13  5:51 PM      Result Value Range   hCG, Beta Chain, [place] Longest 78295 (*) <5 mIU/mL   US Ob Transvaginal  03/24/2013   CLINICAL DATA:  Vaginal bleeding, pregnant  EXAM: TRANSVAGINAL OB ULTRASOUND  TECHNIQUE: Transvaginal ultrasound was performed for complete evaluation of the gestation as well as the maternal uterus, adnexal regions, and pelvic cul-de-sac.  COMPARISON:  03/14/2013  FINDINGS: Intrauterine gestational sac: Visualized/normal in shape.  Yolk sac:  Visualized  Embryo:  Visualized  Cardiac Activity: Visualized  Heart Rate: 172 Bpm  CRL:   36  mm   10 w 4d                  [place] EDC: 01/09/13  Maternal uterus/adnexae: Small subchorionic hemorrhage identified measuring 2.2 cm maximally. Ovaries are normal.  IMPRESSION: Concordant growth of single live intrauterine fetus measuring 10 weeks 4 days today. Small subchorionic hemorrhage.   Electronically Signed   By: Christiana Pellant M.D.   On: 03/24/2013 18:52     MAU Course  Procedures None  MDM IUP on [place] on 03/14/13 UA, CBC and [place] today  Assessment and Plan  A: IUP at [redacted]w[redacted]d with cardiac activity Small subchorionic hemorrhage  P: Discharge home Bleeding precautions discussed Pelvic rest  advised Patient encouraged to start prenatal care as scheduled on 03/29/13 in [facility] clinic Patient may return to MAU as needed or if her condition were  to change or worsen   Freddi Starr, PA-C  03/24/2013, 7:14 PM

## 2013-03-24 NOTE — MAU Note (Signed)
Pt presents with complaints of bright red vaginal bleeding that started about an hour ago. Pt denies any abdominal cramping

## 2013-03-28 ENCOUNTER — Encounter: Payer: Self-pay | Admitting: General Practice

## 2013-03-29 ENCOUNTER — Ambulatory Visit (INDEPENDENT_AMBULATORY_CARE_PROVIDER_SITE_OTHER): Payer: Self-pay | Admitting: Family Medicine

## 2013-03-29 ENCOUNTER — Encounter: Payer: Self-pay | Admitting: Family Medicine

## 2013-03-29 VITALS — BP 112/76 | Temp 98.7°F | Wt 147.3 lb

## 2013-03-29 DIAGNOSIS — O09219 Supervision of pregnancy with history of pre-term labor, unspecified trimester: Secondary | ICD-10-CM

## 2013-03-29 DIAGNOSIS — Z8751 Personal history of pre-term labor: Secondary | ICD-10-CM

## 2013-03-29 DIAGNOSIS — Z23 Encounter for immunization: Secondary | ICD-10-CM

## 2013-03-29 DIAGNOSIS — O09299 Supervision of pregnancy with other poor reproductive or obstetric history, unspecified trimester: Secondary | ICD-10-CM

## 2013-03-29 DIAGNOSIS — O0991 Supervision of high risk pregnancy, unspecified, first trimester: Secondary | ICD-10-CM

## 2013-03-29 LAB — POCT URINALYSIS DIP (DEVICE)
Bilirubin Urine: NEGATIVE
Glucose, UA: NEGATIVE mg/dL
Ketones, ur: NEGATIVE mg/dL
Leukocytes, UA: NEGATIVE
Nitrite: NEGATIVE

## 2013-03-29 NOTE — Addendum Note (Signed)
Addended by: Franchot Mimes on: 03/29/2013 02:13 PM   Modules accepted: Orders

## 2013-03-29 NOTE — Patient Instructions (Signed)
Embarazo  Primer trimestre  (Pregnancy - First Trimester)  Durante el acto sexual, millones de espermatozoides entran en la vagina. Slo 1 espermatozoide penetra y fertiliza al vulo mientras se encuentra en la trompa de [place]. Una semana ms tarde, el vulo fertilizado se implanta en la pared del tero. Un embrin comienza a desarrollarse para ser un beb. A las 6 a 8 semanas se forman los ojos y la cara y los latidos del corazn se pueden ver en la ecografa. Al final de las 12 semanas (primer trimestre) todos los rganos del beb estn formados. Ahora que est embarazada, querr hacer todo lo que est a su alcance para tener un beb sano. Dos de las cosas ms importantes son: Lucilla Edin buena atencin prenatal y seguir las indicaciones del profesional que la asiste. La atencin prenatal incluye toda la asistencia mdica que usted recibe antes del nacimiento del beb. Se lleva a cabo para prevenir y tratar problemas durante el embarazo y [place]. [place] visitas prenatales se [employer]OK, la presin arterial y se solicitan anlisis de [place]. Esto se hace para asegurarse de que usted est sana y el embarazo progrese normalmente.  Una mujer Adelino 25 a [place]. Sin embargo, si usted tiene sobrepeso o [place], su mdico Chartered certified accountant.  El podr hacerle preguntas y responder todas las que usted le haga.  Durante los exmenes prenatales se solicitan anlisis de [place], cultivos del [place], un [place] y otros anlisis necesarios. Estas pruebas se realizan para controlar su salud y la del beb. Se recomienda que se haga la prueba para el diagnstico del VIH, con su autorizacin. Este es el virus que causa el [place]. Estas pruebas se realizan porque existen medicamentos que podran administrarle para prevenir que el beb nazca con esta infeccin, si usted estuviera infectada y no lo supiera. Los [employer] de sangre  tambin se Insurance underwriter para Actor tipo de [place], si tuvo infecciones previas y Chief Technology Officer sus niveles en la sangre (hemoglobina).  Tener un recuento bajo de hemoglobina (anemia) es comn durante Water quality scientist. Para prevenirla, se administran hierro y vitaminas. En una etapa ms avanzada del [place], le indicarn exmenes de sangre para saber si tiene diabetes, junto con otros anlisis, en caso de que [place].  Es necesario que se haga las pruebas para asegurarse de que usted y el beb estn bien. CAMBIOS DURANTE EL PRIMER TRIMESTRE  Su organismo atravesar numerosos cambios durante el [place]. Estos pueden variar de Ardelia Mems persona a otra. Converse con el mdico acerca los cambios que usted nota y que la preocupan. Ellos son:   El perodo menstrual se detiene.  El vulo y los espermatozoides llevan los genes que determinan cmo seremos. Sus genes y los de su pareja forman el beb. Los genes del varn determinan si ser un nio o una nia.  La circunferencia de la cintura va a ir aumentando y podr sentirse hinchada.  Puede tener Higher education careers adviser (nuseas) y vmitos. Si no puede controlar los vmitos, consulte a su mdico.  Sus mamas comenzarn a agrandarse y sensibilizarse.  Los pezones pueden sobresalir ms y ser ms oscuros.  Tendr necesidad de orinar ms. El dolor al orinar puede significar que usted tiene una infeccin de la vejiga.  Se cansar con facilidad.  Prdida del apetito.  Sentir un fuerte deseo de consumir ciertos alimentos.  Al principio, usted puede ganar o perder un par de kilos.  Podr tener cambios emocionales de un da a otro (entusiasmo por estar embarazada o preocupacin por Water quality scientist y el beb).  Tendr sueos ms vvidos y extraos. INSTRUCCIONES PARA EL CUIDADO EN EL HOGAR   Es muy importante evitar el cigarrillo, el alcohol y los frmacos no recetados Solicitor. Estas sustancias afectan la formacin y el desarrollo del beb.  Evite los productos qumicos durante el embarazo para Tourist information centre manager salud del beb.  Comience las consultas prenatales alrededor de la 12 semana de [place]. Generalmente se programan cada mes al principio y se hacen ms frecuentes en los 2 ltimos meses antes del parto. Cumpla con las citas de control. Siga las indicaciones del mdico con respecto al uso de [place], los anlisis y pruebas de [place], los ejercicios y Engineer, technical sales.  Durante el embarazo debe obtener nutrientes para usted y para su beb. Consuma alimentos balanceados. Elija alimentos como carne, pescado, [place] y otros productos lcteos descremados, vegetales, frutas, panes integrales y cereales. El Viacom informar cul es el aumento de peso ideal.  Las nuseas matinales pueden aliviarse si come algunas galletitas saladas en la cama. Coma dos galletitas antes de levantarse por la maana. Tambin puede comer galletitas sin sal.  Hacer 4 o 5 comidas pequeas en lugar de 3 comidas grandes por da tambin puede [employer] nuseas y los vmitos.  Beber lquidos Le[employer] comidas en lugar de tomarlos durante las comidas tambin puede ayudar a Actor las nuseas y los vmitos.  Puede continuar teniendo [employer] durante todo el embarazo si no hay otros problemas. Los problemas pueden ser una prdida precoz (prematura) de lquido amnitico, sangrado vaginal, o dolor en el vientre (abdominal).  [place], si no tiene restricciones. Consulte con su mdico o terapeuta fsico si no est segura de algunos de sus ejercicios. El mayor aumento de peso se producir en los ltimos 2 trimestres del Media planner. El ejercicio le ayudar a:  Engineer, technical sales.  Mantenerse en forma.  Prepararse para el parto.  La ayudar a perder el peso del embarazo despus de que nazca su beb.  Use un buen sostn o como los que se usan para hacer deportes para Public house manager la sensibilidad de las [place]. Tambin puede serle  til si lo [place] mientras duerme.  Consulte cuando puede comenzar con las clases de pre parto. Comience con las clases cuando estn disponibles.  No utilice la baera con agua caliente, baos turcos y saunas.   Colquese el cinturn de seguridad cuando conduzca. Este la proteger a usted y al beb en caso de accidente.  Evite comer carne cruda y el contacto con los utensilios y desperdicios de los gatos. Estos elementos contienen grmenes que pueden causar defectos de nacimiento en el beb.  El primer trimestre es un buen momento para visitar a su dentista y Leisure centre manager. Es importante mantener los dientes limpios. Use un cepillo de dientes suave y cepllese con ms suavidad durante el [employer] Corporation.  Pida ayuda si tienen necesidades financieras, teraputicas o nutricionales. El profesional podr ayudarla con respecto a estas necesidades, o derivarla a otros especialistas.  No tome medicamentos o hierbas excepto aquellos que Firefighter.  Informe a su mdico si sufre violencia familiar mental o fsica.  Haga una lista de nmeros de telfono de Freight forwarder de la familia, los amigos, el hospital y los departamentos de polica y bomberos.  Escriba sus preguntas. Llvelas cuando concurra a su visita prenatal.  No se  haga duchas vaginales.  No cruce las piernas.  Si usted tiene que estar parada por largos perodos de [place], gire los pies o de pequeos pasos en crculo.  Es posible que tenga ms secreciones vaginales que puedan requerir una toalla higinica. No use tampones o toallas higinicas perfumadas. EL CONSUMO DE MEDICAMENTOS Y FRMACOS DURANTE EL EMBARAZO   Tome las vitaminas para la etapa prenatal tal como se le indic. Las vitaminas deben contener un miligramo de cido flico. Guarde todas las vitaminas fuera del alcance de los nios. La ingestin de slo un par de vitaminas o tabletas que contengan hierro pueden ocasionar la [employer] en un beb o en un nio  pequeo.  Evite el uso de [employer] Mutual [employer] Omaha, incluyendo hierbas, medicamentos de [employer], sin receta o que no hayan sido sugeridos por su mdico. Slo tome medicamentos de venta libre o medicamentos recetados para Chief Technology Officer, Environmental health practitioner o fiebre como lo indique su mdico. No tome aspirina, ibuprofeno o naproxeno excepto que su mdico se lo indique.  Infrmele al profesional si consume medicamentos de hierbas.  El alcohol se relaciona con ciertos defectos congnitos. Incluye el sndrome de alcoholismo fetal. Debe evitar absolutamente el consumo de alcohol, en cualquier forma. El fumar causar baja tasa de natalidad y bebs prematuros.  Las drogas ilegales o de la calle son muy perjudiciales para el beb. Estn absolutamente prohibidas. Un beb que nace de American Express, ser adicto al nacer. Ese beb tendr los mismos sntomas de abstinencia que un adulto.  Informe a su mdico acerca de los medicamentos que ha tomado y el motivo por el que los tom. SOLICITE ATENCIN MDICA SI:  Tiene preguntas o preocupaciones relacionadas con el embarazo. Es mejor que llame para formular las preguntas si no puede esperar hasta la prxima visita, que sentirse preocupada por ellas.  SOLICITE ATENCIN MDICA DE INMEDIATO SI:   La temperatura oral le sube a ms de 102 F (38.9 C) o lo que su mdico le indique.  Tiene una prdida de lquido por la vagina (canal de parto). Si sospecha una ruptura de las [place], tmese la temperatura y llame al profesional para informarlo sobre esto.  Observa unas pequeas manchas o una hemorragia vaginal. Notifique al profesional acerca de la cantidad y de cuntos apsitos est utilizando.  Presenta un olor desagradable en la secrecin vaginal y observa un cambio en el color.  Contina con las nuseas y no obtiene alivio de los remedios indicados. Vomita sangre o algo similar a la borra del caf.  Pierde ms de 2 libras (1 Kg) en una semana.  Aumenta ms de 1 Kg  en una semana y [address], las manos, los pies o las piernas hinchados.  Aumenta ms de 2,5 Kg en una semana (aunque no tenga las manos, pies, piernas o el rostro hinchados).  Ha estado expuesta a la rubola y no ha sufrido la enfermedad.  Ha estado expuesta a la quinta enfermedad o a la varicela.  Siente dolor en el vientre (abdominal). Las [employer]ul en el ligamento redondo son Neomia Dear causa benigna frecuente de dolor abdominal durante el embarazo. El profesional que la asiste deber evaluarla.  Presenta dolor de Renne Musca, diarrea, dolor al orinar o le falta la respiracin.  Se cae, se ve involucrada en un accidente automovilstico o sufre algn tipo de traumatismo.  En su hogar hay violencia mental o fsica. Document Released: 03/17/2005 Document Revised: 03/01/2012 [facility] Patient Information 2014 [place], [place].

## 2013-03-29 NOTE — Progress Notes (Signed)
P= 94  03/14/2013 and 03/24/2013 scant bleeding, visited MAU. Initial visit. New OB patient information given. Discussed BMI and appropriate  Weight gain.

## 2013-03-29 NOTE — Addendum Note (Signed)
Addended by: Faythe Casa on: 03/29/2013 04:40 PM   Modules accepted: Orders

## 2013-03-29 NOTE — Progress Notes (Signed)
Subjective:    Veronica Mccormick is a [id number] [redacted]w[redacted]d being seen today for her first obstetrical visit.  Her obstetrical history is significant for PPROM at 21wk and delivery at 65 with last infant. Patient does intend to breast feed. Pregnancy history fully reviewed.  Patient reports no bleeding, no contractions, no cramping and no leaking.  Filed Vitals:   03/29/13 1021  BP: 112/76  Temp: 98.7 F (37.1 C)  Weight: 147 lb 4.8 oz (66.815 kg)    HISTORY: OB History  Gravida Para Term Preterm AB SAB TAB Ectopic Multiple Living  8 4 3 1 3 2  0 1 0 3    # Outcome Date GA Lbr Len/2nd Weight Sex Delivery Anes PTL Lv  8 CUR           7 PRE 2010 [redacted]w[redacted]d  12 oz (0.34 kg)     ND     Comments: 6 months   6 SAB 2007             Comments: 4.5 weeks  5 ECT 2006          4 TRM 03/31/03 [redacted]w[redacted]d  6 lb (2.722 kg) F SVD None  Y  3 SAB 2002             Comments: 8 weeks  2 TRM 02/15/97 [redacted]w[redacted]d  7 lb (3.175 kg) M  None  Y  1 TRM 01/29/95 [redacted]w[redacted]d  7 lb (3.175 kg) M SVD None  Y     Past Medical History  Diagnosis Date  . Medical history non-contributory   . Depression     Postpartum after fetal demise   Past Surgical History  Procedure Laterality Date  . Breast surgery     Family History  Problem Relation Age of Onset  . Alcohol abuse Neg Hx   . Arthritis Neg Hx   . Asthma Neg Hx   . Birth defects Neg Hx   . Cancer Neg Hx   . COPD Neg Hx   . Depression Neg Hx   . Diabetes Neg Hx   . Drug abuse Neg Hx   . Early death Neg Hx   . Hearing loss Neg Hx   . Heart disease Neg Hx   . Hypertension Neg Hx   . Hyperlipidemia Neg Hx   . Kidney disease Neg Hx   . Learning disabilities Neg Hx   . Mental illness Neg Hx   . Mental retardation Neg Hx   . Miscarriages / Stillbirths Neg Hx   . Stroke Neg Hx   . Vision loss Neg Hx      Exam    Uterus:     Pelvic Exam:    Perineum: No Hemorrhoids   Vulva: normal   Vagina:  normal mucosa   pH:     Cervix: clsoed   Adnexa: normal adnexa and  no mass, fullness, tenderness           Skin: normal coloration and turgor, no rashes    Neurologic: oriented, normal, normal mood   Extremities: normal strength, tone, and muscle mass   HEENT extra ocular movement intact and sclera clear, anicteric   Mouth/Teeth mucous membranes moist, pharynx normal without lesions   Neck supple   Cardiovascular: regular rate and rhythm, no murmurs or gallops   Respiratory:  appears well, vitals normal, no respiratory distress, acyanotic, normal RR, ear and throat exam is normal, neck free of mass or lymphadenopathy, chest clear, no wheezing, crepitations, rhonchi,  normal symmetric air entry   Abdomen: soft, non-tender; bowel sounds normal; no masses,  no organomegaly   Urinary: urethral meatus normal      Assessment:    Pregnancy: [id number] There are no active problems to display for this patient.     Veronica Mccormick is a 32 y.o. [id number] at [redacted]w[redacted]d here for initial ROB visit.  Discussed with Patient:  - New OB labs from last visit were drawn today. - RTC for any VB, regular, painful cramps/ctxs occurring at a rate of >2/10 min, fever (100.5 or higher), n/v/d, any pain that is unresolving or worsening. - at next visit offer genetic screening - Routine precautions(SAB, depression, infection s/s) - RTC in 4 weeks for next appt. - referred to MFM for evaluation for cerclage and progesterone with hx of 21wk PPROM  Edu: [x ] PTL precautions; [ ]  BF class; [ ]  childbirth class; [ ]   BF counseling;     Veronica Mccormick 03/29/2013

## 2013-03-30 ENCOUNTER — Encounter: Payer: Self-pay | Admitting: *Deleted

## 2013-03-30 LAB — PRESCRIPTION MONITORING PROFILE (19 PANEL)
Barbiturate Screen, Urine: NEGATIVE ng/mL
Benzodiazepine Screen, Urine: NEGATIVE ng/mL
Cannabinoid Scrn, Ur: NEGATIVE ng/mL
Cocaine Metabolites: NEGATIVE ng/mL
Creatinine, Urine: 116.74 mg/dL (ref >20.0)
Fentanyl, Ur: NEGATIVE ng/mL
MDMA URINE: NEGATIVE ng/mL
Meperidine, Ur: NEGATIVE ng/mL
Opiate Screen, Urine: NEGATIVE ng/mL
Phencyclidine, Ur: NEGATIVE ng/mL
Tramadol Scrn, Ur: NEGATIVE ng/mL
Zolpidem, Urine: NEGATIVE ng/mL
pH, Initial: 5.9 pH (ref 4.5–8.9)

## 2013-03-30 LAB — OBSTETRIC PANEL
Antibody Screen: NEGATIVE
HCT: 34.7 % — ABNORMAL LOW (ref 36.0–46.0)
Hemoglobin: 12 g/dL (ref 12.0–15.0)
Hepatitis B Surface Ag: NEGATIVE
Lymphocytes Relative: 21 % (ref 12–46)
Monocytes Absolute: 0.5 10*3/uL (ref 0.1–1.0)
Monocytes Relative: 6 % (ref 3–12)
Neutro Abs: 5.9 10*3/uL (ref 1.7–7.7)
Neutrophils Relative %: 72 % (ref 43–77)
RBC: 3.84 MIL/uL — ABNORMAL LOW (ref 3.87–5.11)
Rh Type: POSITIVE
Rubella: 11.2 Index — ABNORMAL HIGH (ref <0.90)
WBC: 8.2 10*3/uL (ref 4.0–10.5)

## 2013-04-02 LAB — CULTURE, OB URINE: Colony Count: 65000

## 2013-04-03 LAB — HEMOGLOBINOPATHY EVALUATION
Hemoglobin Other: 0 %
Hgb A2 Quant: 2.9 % (ref 2.2–3.2)
Hgb F Quant: 0 % (ref 0.0–2.0)

## 2013-04-06 ENCOUNTER — Encounter: Payer: Self-pay | Admitting: *Deleted

## 2013-04-06 DIAGNOSIS — O09299 Supervision of pregnancy with other poor reproductive or obstetric history, unspecified trimester: Secondary | ICD-10-CM | POA: Insufficient documentation

## 2013-04-06 DIAGNOSIS — O09219 Supervision of pregnancy with history of pre-term labor, unspecified trimester: Secondary | ICD-10-CM | POA: Insufficient documentation

## 2013-04-26 ENCOUNTER — Encounter: Payer: Self-pay | Admitting: Family

## 2013-04-26 ENCOUNTER — Ambulatory Visit (INDEPENDENT_AMBULATORY_CARE_PROVIDER_SITE_OTHER): Payer: Self-pay | Admitting: Family

## 2013-04-26 VITALS — BP 122/72 | Temp 97.7°F | Wt 153.9 lb

## 2013-04-26 DIAGNOSIS — O09219 Supervision of pregnancy with history of pre-term labor, unspecified trimester: Secondary | ICD-10-CM

## 2013-04-26 DIAGNOSIS — O09899 Supervision of other high risk pregnancies, unspecified trimester: Secondary | ICD-10-CM

## 2013-04-26 DIAGNOSIS — Z8751 Personal history of pre-term labor: Secondary | ICD-10-CM

## 2013-04-26 HISTORY — DX: Supervision of other high risk pregnancies, unspecified trimester: O09.899

## 2013-04-26 LAB — POCT URINALYSIS DIP (DEVICE)
Protein, ur: NEGATIVE mg/dL
Specific Gravity, Urine: >=1.03 (ref 1.005–1.030)
Urobilinogen, UA: 0.2 mg/dL (ref 0.0–1.0)
pH: 5.5 (ref 5.0–8.0)

## 2013-04-26 MED ORDER — AMOXICILLIN 500 MG PO CAPS: 500.0000 mg | ORAL_CAPSULE | Freq: Three times a day (TID) | ORAL | Status: DC

## 2013-04-26 MED ORDER — HYDROXYPROGESTERONE CAPROATE 250 MG/ML IM OIL: 250.0000 mg | TOPICAL_OIL | INTRAMUSCULAR | Status: DC

## 2013-04-26 MED ORDER — HYDROXYPROGESTERONE CAPROATE 250 MG/ML IM OIL
250.0000 mg | TOPICAL_OIL | INTRAMUSCULAR | Status: AC
  Administered 2013-05-10 – 2013-09-10 (×14): 250 mg via INTRAMUSCULAR

## 2013-04-26 MED ORDER — NITROFURANTOIN MONOHYD MACRO 100 MG PO CAPS: 100.0000 mg | ORAL_CAPSULE | Freq: Two times a day (BID) | ORAL | Status: DC

## 2013-04-26 NOTE — Progress Notes (Signed)
Nutrition note: 1st nutr visit Pt has gained 19.9# @ [redacted]w[redacted]d, which is > expected.  Pt reports eating 3 meals & 1 snack/d. (Pt reports she has decreased number of snacks/d due to excess wt gain.) Pt is taking PNV. Pt reports no N/V but has some heartburn. NKFA. Pt received verbal & written education on general nutrition during pregnancy in Spanish via interpreter, Dori. Discussed tips to decrease heartburn. Encouraged exclusive BF for as long as she can. Discussed wt gain goals of 15-25# or 0.6#/wk.  Pt agrees to continue taking PNV and monitor her portion sizes. Pt has WIC and plans to BF. F/u if referred Blondell Reveal, MS, RD, LDN, [facility]

## 2013-04-26 NOTE — Progress Notes (Signed)
Pt has no insurance and will not be able to afford Macrobid Rx. Per consult with Dr. Debroah Loop, pt was prescribed Amoxicillin - see order.

## 2013-04-26 NOTE — Progress Notes (Signed)
OB history reviewed extensively with Dr. Penne Lash; pt has history of 3 term NSVD followed by SAB, ectopic and 21 wk PPROM that was admitted at 2-3 cm and delivered 3 wks later. Pt was counseled regarding 17 p injections and agrees to accept; will monitor every 2 weeks either by cervical exam or ultrasound until 22 weeks.  Cervical length by ultrasound ordered for this week.   Pt desires quad screen > obtain at 18 wks.   Pt measuring large, however dated by 10 wk ultrasound.  RX for Macrobid for bacteria in urine in October.

## 2013-04-26 NOTE — Progress Notes (Signed)
P-[id number] 

## 2013-04-26 NOTE — Progress Notes (Signed)
U/S scheduled 04/27/13 at 945 am.

## 2013-04-27 ENCOUNTER — Other Ambulatory Visit (HOSPITAL_COMMUNITY): Payer: Self-pay | Admitting: Family

## 2013-04-27 ENCOUNTER — Ambulatory Visit (HOSPITAL_COMMUNITY)
Admission: RE | Admit: 2013-04-27 | Discharge: 2013-04-27 | Disposition: A | Discharge status: Home / Self Care | Payer: Medicaid Other | Source: Ambulatory Visit | Attending: Family | Admitting: Family

## 2013-04-27 ENCOUNTER — Other Ambulatory Visit: Payer: Self-pay | Admitting: Family

## 2013-04-27 DIAGNOSIS — Z8751 Personal history of pre-term labor: Secondary | ICD-10-CM

## 2013-04-27 DIAGNOSIS — O09219 Supervision of pregnancy with history of pre-term labor, unspecified trimester: Secondary | ICD-10-CM | POA: Insufficient documentation

## 2013-05-03 ENCOUNTER — Ambulatory Visit: Payer: Self-pay

## 2013-05-04 ENCOUNTER — Encounter: Payer: Self-pay | Admitting: *Deleted

## 2013-05-10 ENCOUNTER — Ambulatory Visit (INDEPENDENT_AMBULATORY_CARE_PROVIDER_SITE_OTHER): Payer: Self-pay | Admitting: Obstetrics & Gynecology

## 2013-05-10 VITALS — BP 112/71 | Temp 97.4°F | Wt 155.7 lb

## 2013-05-10 DIAGNOSIS — O09299 Supervision of pregnancy with other poor reproductive or obstetric history, unspecified trimester: Secondary | ICD-10-CM

## 2013-05-10 LAB — POCT URINALYSIS DIP (DEVICE)
Glucose, UA: NEGATIVE mg/dL
Ketones, ur: NEGATIVE mg/dL
Nitrite: NEGATIVE
Protein, ur: NEGATIVE mg/dL
Specific Gravity, Urine: >=1.03 (ref 1.005–1.030)
Urobilinogen, UA: 0.2 mg/dL (ref 0.0–1.0)
pH: 5.5 (ref 5.0–8.0)

## 2013-05-10 NOTE — Progress Notes (Signed)
Pulse- 92  Edema-feet, hands Pt reports when stands up washing dishes she seems to be fatigue and some dizziness

## 2013-05-10 NOTE — Patient Instructions (Signed)
Informacin sobre el parto prematuro  (Preterm Labor Information) El parto prematuro comienza antes de la semana 37 de embarazo. La duracin de un embarazo normal es de 39 a 41 semanas.  CAUSAS  Generalmente no hay una causa que pueda identificarse del motivo por el que una mujer comienza un trabajo de parto prematuro. Sin embargo, una de las causas conocidas ms frecuentes son las infecciones. Las infecciones del tero, el cuello, la vagina, el lquido amnitico, la vejiga, los riones y hasta de los pulmones (neumona) pueden hacer que el trabajo de parto se inicie. Otras causas que pueden sospecharse son:   Infecciones urogenitales, como infecciones por hongos y vaginosis bacteriana.   Anormalidades uterinas (forma del tero, sptum uterino, fibromas, hemorragias en la placenta).   Un cuello que ha sido operado (puede ser que no permanezca cerrado).   Malformaciones del feto.   Gestaciones mltiples (mellizos, trillizos y ms).   Ruptura del saco amnitico.  FACTORES DE RIESGO   Historia previa de parto prematuro.   Tener ruptura prematura de las membranas (RPM).   La placenta cubre la abertura del cuello (placenta previa).   La placenta se separa del tero (abrupcin placentaria).   El cuello es demasiado dbil para contener al beb en el tero (cuello incompetente).   Hay mucho lquido en el saco amnitico (polihidramnios).   Consumo de drogas o hbito de fumar durante el embarazo.   No aumentar de peso lo suficiente durante el embarazo.   Mujeres menores de 18 aos o mayores de 35 aos.   Nivel socioeconmico bajo.   Pertenecer a la raza afroamericana. SNTOMAS  Los signos y sntomas del trabajo de parto prematuro son:   Clicos similares a los menstruales, dolor abdominal o dolor de espalda.  Contracciones uterinas regulares, tan frecuentes como seis por hora, sin importar su intensidad (pueden ser suaves o dolorosas).  Contracciones que comienzan  en la parte superior del tero y se expanden hacia abajo, a la zona inferior del abdomen y la espalda.   Sensacin de aumento de presin en la pelvis.   Aparece una secrecin acuosa o sanguinolenta por la vagina.  TRATAMIENTO  Segn el tiempo del embarazo y otras circunstancias, el mdico puede indicar reposo en cama. Si es necesario, le indicarn medicamentos para detener las contracciones y para madurar los pulmones del feto. Si el trabajo de parto se inicia antes de las 34 semanas de embarazo, se recomienda la hospitalizacin. El tratamiento depende de las condiciones en que se encuentren usted y el feto.  QU DEBE HACER SI PIENSA QUE EST EN TRABAJO DE PARTO PREMATURO?  Comunquese con su mdico inmediatamente. Debe concurrir al hospital para ser controlada inmediatamente.  CMO PUEDE EVITAR EL TRABAJO DE PARTO PREMATURO EN FUTUROS EMBARAZOS?  Usted debe:   Si fuma, abandonar el hbito.  Mantener un peso saludable y evitar sustancias qumicas y drogas innecesarias.  Controlar todo tipo de infeccin.  Informe a su mdico si tiene una historia conocida de trabajo de parto prematuro. Document Released: 09/14/2007 Document Revised: 02/07/2013 ExitCare Patient Information 2014 [employer].  

## 2013-05-10 NOTE — Progress Notes (Signed)
Start 17 P today. Schedule detailed [place]

## 2013-05-16 ENCOUNTER — Ambulatory Visit (INDEPENDENT_AMBULATORY_CARE_PROVIDER_SITE_OTHER): Payer: Self-pay | Admitting: *Deleted

## 2013-05-16 VITALS — BP 95/57 | HR 95 | Temp 98.0°F | Wt 158.3 lb

## 2013-05-16 DIAGNOSIS — O09212 Supervision of pregnancy with history of pre-term labor, second trimester: Secondary | ICD-10-CM

## 2013-05-16 DIAGNOSIS — O09219 Supervision of pregnancy with history of pre-term labor, unspecified trimester: Secondary | ICD-10-CM

## 2013-05-21 ENCOUNTER — Ambulatory Visit (HOSPITAL_COMMUNITY)
Admission: RE | Admit: 2013-05-21 | Discharge: 2013-05-21 | Disposition: A | Discharge status: Home / Self Care | Payer: Medicaid Other | Source: Ambulatory Visit | Attending: Obstetrics & Gynecology | Admitting: Obstetrics & Gynecology

## 2013-05-21 DIAGNOSIS — Z8751 Personal history of pre-term labor: Secondary | ICD-10-CM | POA: Insufficient documentation

## 2013-05-21 DIAGNOSIS — O09299 Supervision of pregnancy with other poor reproductive or obstetric history, unspecified trimester: Secondary | ICD-10-CM | POA: Insufficient documentation

## 2013-05-21 DIAGNOSIS — Z3689 Encounter for other specified antenatal screening: Secondary | ICD-10-CM | POA: Insufficient documentation

## 2013-05-22 ENCOUNTER — Encounter (HOSPITAL_COMMUNITY)
Admission: RE | Disposition: A | Discharge status: Home / Self Care | Payer: Self-pay | Source: Ambulatory Visit | Attending: Obstetrics & Gynecology

## 2013-05-22 ENCOUNTER — Ambulatory Visit (HOSPITAL_COMMUNITY): Payer: MEDICAID | Admitting: Anesthesiology

## 2013-05-22 ENCOUNTER — Encounter (HOSPITAL_COMMUNITY): Payer: MEDICAID | Admitting: Anesthesiology

## 2013-05-22 ENCOUNTER — Ambulatory Visit (HOSPITAL_COMMUNITY)
Admission: RE | Admit: 2013-05-22 | Discharge: 2013-05-22 | Disposition: A | Discharge status: Home / Self Care | Payer: MEDICAID | Source: Ambulatory Visit | Attending: Obstetrics & Gynecology | Admitting: Obstetrics & Gynecology

## 2013-05-22 ENCOUNTER — Encounter (HOSPITAL_COMMUNITY): Payer: Self-pay | Admitting: *Deleted

## 2013-05-22 DIAGNOSIS — O3430 Maternal care for cervical incompetence, unspecified trimester: Secondary | ICD-10-CM | POA: Insufficient documentation

## 2013-05-22 DIAGNOSIS — O3432 Maternal care for cervical incompetence, second trimester: Secondary | ICD-10-CM

## 2013-05-22 HISTORY — PX: CERVICAL CERCLAGE: SHX1329

## 2013-05-22 LAB — CBC
HCT: 32.7 % — ABNORMAL LOW (ref 36.0–46.0)
Hemoglobin: 11.2 g/dL — ABNORMAL LOW (ref 12.0–15.0)
MCH: 31.1 pg (ref 26.0–34.0)
MCV: 90.8 fL (ref 78.0–100.0)
Platelets: 295 10*3/uL (ref 150–400)
RBC: 3.6 MIL/uL — ABNORMAL LOW (ref 3.87–5.11)
RDW: 12.8 % (ref 11.5–15.5)
WBC: 10 10*3/uL (ref 4.0–10.5)

## 2013-05-22 SURGERY — CERCLAGE, CERVIX, VAGINAL APPROACH
Anesthesia: Spinal | Site: Vagina

## 2013-05-22 MED ORDER — ONDANSETRON HCL 4 MG/2ML IJ SOLN
INTRAMUSCULAR | Status: DC | PRN
  Administered 2013-05-22: 4 mg via INTRAVENOUS

## 2013-05-22 MED ORDER — FENTANYL CITRATE 0.05 MG/ML IJ SOLN
25.0000 ug | INTRAMUSCULAR | Status: DC | PRN
  Administered 2013-05-22: 50 ug via INTRAVENOUS

## 2013-05-22 MED ORDER — MEPERIDINE HCL 25 MG/ML IJ SOLN: 6.2500 mg | INTRAMUSCULAR | Status: DC | PRN

## 2013-05-22 MED ORDER — LACTATED RINGERS IV SOLN: INTRAVENOUS | Status: DC

## 2013-05-22 MED ORDER — ONDANSETRON HCL 4 MG/2ML IJ SOLN
INTRAMUSCULAR | Status: AC
  Filled 2013-05-22: qty 2

## 2013-05-22 MED ORDER — LACTATED RINGERS IV SOLN
INTRAVENOUS | Status: DC | PRN
  Administered 2013-05-22 (×3): via INTRAVENOUS

## 2013-05-22 MED ORDER — FENTANYL CITRATE 0.05 MG/ML IJ SOLN
INTRAMUSCULAR | Status: AC
  Administered 2013-05-22: 50 ug via INTRAVENOUS
  Filled 2013-05-22: qty 2

## 2013-05-22 MED ORDER — FENTANYL CITRATE 0.05 MG/ML IJ SOLN
INTRAMUSCULAR | Status: DC | PRN
  Administered 2013-05-22 (×3): 50 ug via INTRAVENOUS

## 2013-05-22 MED ORDER — CEFAZOLIN SODIUM-DEXTROSE 2-3 GM-% IV SOLR
INTRAVENOUS | Status: AC
  Filled 2013-05-22: qty 50

## 2013-05-22 MED ORDER — FENTANYL CITRATE 0.05 MG/ML IJ SOLN
INTRAMUSCULAR | Status: AC
  Filled 2013-05-22: qty 2

## 2013-05-22 MED ORDER — CEFAZOLIN SODIUM-DEXTROSE 2-3 GM-% IV SOLR
2.0000 g | INTRAVENOUS | Status: AC
  Administered 2013-05-22: 2 g via INTRAVENOUS

## 2013-05-22 MED ORDER — HYDROCODONE-ACETAMINOPHEN 5-325 MG PO TABS
ORAL_TABLET | ORAL | Status: AC
  Administered 2013-05-22: 1 via ORAL
  Filled 2013-05-22: qty 1

## 2013-05-22 MED ORDER — FENTANYL CITRATE 0.05 MG/ML IJ SOLN
INTRAMUSCULAR | Status: AC
Start: 2013-05-22 — End: 2013-05-22
  Filled 2013-05-22: qty 2

## 2013-05-22 MED ORDER — PROMETHAZINE HCL 25 MG/ML IJ SOLN: 6.2500 mg | INTRAMUSCULAR | Status: DC | PRN

## 2013-05-22 MED ORDER — HYDROCODONE-ACETAMINOPHEN 5-325 MG PO TABS
1.0000 | ORAL_TABLET | Freq: Once | ORAL | Status: AC
  Administered 2013-05-22: 1 via ORAL

## 2013-05-22 MED ORDER — BUPIVACAINE IN DEXTROSE 0.75-8.25 % IT SOLN
INTRATHECAL | Status: DC | PRN
  Administered 2013-05-22: 1 mL via INTRATHECAL

## 2013-05-22 MED ORDER — HYDROCODONE-ACETAMINOPHEN 5-300 MG PO TABS: 1.0000 | ORAL_TABLET | Freq: Four times a day (QID) | ORAL | Status: DC | PRN

## 2013-05-22 MED ORDER — LACTATED RINGERS IV SOLN
INTRAVENOUS | Status: DC
  Administered 2013-05-22: 10:00:00 via INTRAVENOUS

## 2013-05-22 SURGICAL SUPPLY — 17 items
CANISTER SUCT 3000ML (MISCELLANEOUS) ×2 IMPLANT
CATH ROBINSON RED A/P 16FR (CATHETERS) ×2 IMPLANT
CLOTH BEACON ORANGE TIMEOUT ST (SAFETY) ×2 IMPLANT
COUNTER NEEDLE 1200 MAGNETIC (NEEDLE) ×2 IMPLANT
GLOVE BIO SURGEON STRL SZ 6.5 (GLOVE) ×2 IMPLANT
GLOVE BIOGEL PI IND STRL 7.0 (GLOVE) ×1 IMPLANT
GLOVE BIOGEL PI INDICATOR 7.0 (GLOVE) ×1
GOWN STRL REIN XL XLG (GOWN DISPOSABLE) ×4 IMPLANT
NEEDLE MAYO .5 CIRCLE (NEEDLE) IMPLANT
NS IRRIG 1000ML POUR BTL (IV SOLUTION) ×2 IMPLANT
PACK VAGINAL MINOR WOMEN LF (CUSTOM PROCEDURE TRAY) ×2 IMPLANT
PAD OB MATERNITY 4.3X12.25 (PERSONAL CARE ITEMS) ×2 IMPLANT
PAD PREP 24X48 CUFFED NSTRL (MISCELLANEOUS) ×2 IMPLANT
SUT MERSILENE 5MM BP 1 12 (SUTURE) ×2 IMPLANT
TOWEL OR 17X24 6PK STRL BLUE (TOWEL DISPOSABLE) ×4 IMPLANT
TUBING NON-CON 1/4 X 20 CONN (TUBING) ×2 IMPLANT
YANKAUER SUCT BULB TIP NO VENT (SUCTIONS) ×2 IMPLANT

## 2013-05-22 NOTE — Anesthesia Postprocedure Evaluation (Signed)
  Anesthesia Post Note  Patient: Veronica Mccormick  Procedure(s) Performed: Procedure(s) (LRB): CERCLAGE CERVICAL (N/A)  Anesthesia type: Spinal  Patient location: PACU  Post pain: Pain level controlled  Post assessment: Post-op Vital signs reviewed  Last Vitals:  Filed Vitals:   05/22/13 0915  BP: 117/72  Pulse: 97  Resp: 18    Post vital signs: Reviewed  Level of consciousness: awake  Complications: No apparent anesthesia complications

## 2013-05-22 NOTE — H&P (Signed)
Veronica Mccormick is an 32 y.o. female. [id number] Patient's last menstrual period was 01/15/2013. [redacted]w[redacted]d Here today for cervical cerclage after [place] yesterday showed cervical funneling in patient with history of second trimester loss consistent with incompetent cervix. She was counseled by Dr. Sherrie George and she requested scheduling for cerclage. The procedure and the risk of anesthesia, bleeding, preterm delivery, ROM, infection, pain were discussed.      Blood transfusions: none Sexually transmitted diseases: no past history Previous GYN Procedures: none  Last mammogram: none Date:   Last pap: normal   Menstrual History: Patient's last menstrual period was 01/15/2013.    Past Medical History  Diagnosis Date  . Medical history non-contributory   . Depression     Postpartum after fetal demise    Past Surgical History  Procedure Laterality Date  . Breast surgery      Family History  Problem Relation Age of Onset  . Alcohol abuse Neg Hx   . Arthritis Neg Hx   . Asthma Neg Hx   . Birth defects Neg Hx   . Cancer Neg Hx   . COPD Neg Hx   . Depression Neg Hx   . Diabetes Neg Hx   . Drug abuse Neg Hx   . Early death Neg Hx   . Hearing loss Neg Hx   . Heart disease Neg Hx   . Hypertension Neg Hx   . Hyperlipidemia Neg Hx   . Kidney disease Neg Hx   . Learning disabilities Neg Hx   . Mental illness Neg Hx   . Mental retardation Neg Hx   . Miscarriages / Stillbirths Neg Hx   . Stroke Neg Hx   . Vision loss Neg Hx     Social History:  reports that she has never smoked. She has never used smokeless tobacco. She reports that she does not drink alcohol or use illicit drugs.  Allergies: No Known Allergies  Facility-administered medications prior to admission  Medication Dose Route Frequency Provider Last Rate Last Dose  . hydroxyprogesterone caproate (DELALUTIN) 250 mg/mL injection 250 mg  250 mg Intramuscular Weekly Walidah N Muhammad, CNM   250 mg at 05/16/13 4540    Prescriptions prior to admission  Medication Sig Dispense Refill  . amoxicillin (AMOXIL) 500 MG capsule Take 1 capsule (500 mg total) by mouth 3 (three) times daily.  21 capsule  0  . Prenatal Vit-Fe Fumarate-FA (PRENATAL MULTIVITAMIN) TABS tablet Take 1 tablet by mouth daily at 12 noon.        Review of Systems  Constitutional: Negative for fever.  Respiratory: Negative for cough.   Cardiovascular: Negative for chest pain.  Genitourinary: Negative for dysuria.    Last menstrual period 01/15/2013. Physical Exam  Constitutional: She is oriented to person, place, and time. She appears well-developed and well-nourished. No distress.  Neck: Normal range of motion. Neck supple.  Respiratory: Effort normal. No respiratory distress.  GI: Soft. There is no tenderness. There is no guarding.  Genitourinary:  deferred  Neurological: She is alert and oriented to person, place, and time.  Skin: Skin is warm and dry.  Psychiatric: She has a normal mood and affect. Her behavior is normal.    No results found for this or any previous visit (from the past 24 hour(s)).  US Ob Detail + 14 Wk  05/21/2013   OBSTETRICAL ULTRASOUND: This exam was performed within a [facility]. The OB US report was generated in the [facility] system, and faxed to  the ordering physician.   This report is also available in [employer] and in the [employer]. See [facility] Obstetric US report.   Assessment/Plan: [redacted]w[redacted]d Incompetent cervix scheduled for cerclage. Consent signed and patient counseled by me and Dr. Sheral Apley from anesthesia dept.  Alassane Kalafut 05/22/2013, 8:58 AM

## 2013-05-22 NOTE — Transfer of Care (Signed)
Immediate Anesthesia Transfer of Care Note  Patient: Veronica Mccormick  Procedure(s) Performed: Procedure(s): CERCLAGE CERVICAL (N/A)  Patient Location: PACU  Anesthesia Type:Spinal  Level of Consciousness: awake, alert , oriented and patient cooperative  Airway & Oxygen Therapy: Patient Spontanous Breathing  Post-op Assessment: Report given to PACU RN and Post -op Vital signs reviewed and stable  Post vital signs: Reviewed, stable  Complications: No apparent anesthesia complications

## 2013-05-22 NOTE — Anesthesia Procedure Notes (Signed)
Spinal  Patient location during procedure: OR Start time: 05/22/2013 10:11 AM Staffing Anesthesiologist: Brayton Caves Performed by: anesthesiologist  Preanesthetic Checklist Completed: patient identified, site marked, surgical consent, pre-op evaluation, timeout performed, IV checked, risks and benefits discussed and monitors and equipment checked Spinal Block Patient position: sitting Prep: DuraPrep Patient monitoring: heart rate, cardiac monitor, continuous pulse ox and blood pressure Approach: midline Location: L3-4 Injection technique: single-shot Needle Needle type: Sprotte  Needle gauge: 24 G Needle length: 9 cm Assessment Sensory level: T4 Additional Notes Patient identified.  Risk benefits discussed including failed block, incomplete pain control, headache, nerve damage, paralysis, blood pressure changes, nausea, vomiting, reactions to medication both toxic or allergic, and postpartum back pain.  Patient expressed understanding and wished to proceed.  All questions were answered.  Sterile technique used throughout procedure.  CSF was clear.  No parasthesia or other complications.  Please see nursing notes for vital signs.

## 2013-05-22 NOTE — Anesthesia Preprocedure Evaluation (Signed)
Anesthesia Evaluation  Patient identified by MRN, date of birth, ID band Patient awake    Reviewed: Allergy & Precautions, H&P , NPO status , Patient's Chart, lab work & pertinent test results  Airway Mallampati: II       Dental   Pulmonary  breath sounds clear to auscultation        Cardiovascular Exercise Tolerance: Good Rhythm:regular Rate:Normal     Neuro/Psych    GI/Hepatic   Endo/Other    Renal/GU      Musculoskeletal   Abdominal   Peds  Hematology   Anesthesia Other Findings   Reproductive/Obstetrics (+) Pregnancy                             Anesthesia Physical Anesthesia Plan  ASA: II  Anesthesia Plan: Spinal   Post-op Pain Management:    Induction:   Airway Management Planned:   Additional Equipment:   Intra-op Plan:   Post-operative Plan:   Informed Consent: I have reviewed the patients History and Physical, chart, labs and discussed the procedure including the risks, benefits and alternatives for the proposed anesthesia with the patient or authorized representative who has indicated his/her understanding and acceptance.     Plan Discussed with: Anesthesiologist, CRNA and Surgeon  Anesthesia Plan Comments:         Anesthesia Quick Evaluation  

## 2013-05-22 NOTE — Op Note (Signed)
Procedure: Cervical cerclage Preoperative diagnosis: Uterine pregnancy [redacted] weeks gestation with cervical funneling and incompetent cervix Postoperative diagnosis: Same Surgeon: Dr. Scheryl Darter Anesthesia: Spinal by Dr. Brayton Caves Estimated blood loss: Negligible Specimen: None Complications: None Drains: Foley catheter Counts: Correct  Patient gave written consent for placement of cervical cerclage after ultrasound showed cervical funneling with a diagnosis of incompetent cervix. Patient was [redacted] weeks gestation. Patient identification was confirmed brought to the operating room and spinal anesthesia was induced. She was placed in dorsal lithotomy position. Perineum and vagina were sterilely prepped and draped and a Foley catheter was placed. Exam under anesthesia revealed cervix that was closed and 20% effaced. Weighted speculum was placed and cervix was visualized. #1 Prolene suture was placed with stitches position at 12 36 and 9:00 and the stitch was tied at 12:00. There was minimal bleeding. No sign of ruptured membranes. Patient tolerated the procedure well without complications. She received 2 g of IV Kefzol at the start of the procedure. She is brought in stable condition to PACU.  Dr. Scheryl Darter 05/22/2013 10:49 AM

## 2013-05-23 ENCOUNTER — Encounter (HOSPITAL_COMMUNITY): Payer: Self-pay | Admitting: Obstetrics & Gynecology

## 2013-05-24 ENCOUNTER — Ambulatory Visit (INDEPENDENT_AMBULATORY_CARE_PROVIDER_SITE_OTHER): Payer: Self-pay

## 2013-05-24 VITALS — BP 128/80 | HR 102 | Temp 97.6°F | Wt 158.7 lb

## 2013-05-24 DIAGNOSIS — O09219 Supervision of pregnancy with history of pre-term labor, unspecified trimester: Secondary | ICD-10-CM

## 2013-05-24 NOTE — Progress Notes (Signed)
Pt. Here today for 17P injection. States she had a cerclage place on Tuesday and is wondering if we are continuing with injections or starting a pill inserted intravaginally. Spoke to Dr. Debroah Loop who states pt. Will continue with 17P. Pt. Verbalizes understanding and gratitude. Informed pt. Of next weeks appointment 05/31/13 and that she will see Dr. Debroah Loop for a visit then.

## 2013-05-31 ENCOUNTER — Ambulatory Visit (INDEPENDENT_AMBULATORY_CARE_PROVIDER_SITE_OTHER): Payer: Self-pay | Admitting: Obstetrics & Gynecology

## 2013-05-31 VITALS — BP 124/76 | Temp 97.6°F | Wt 159.3 lb

## 2013-05-31 DIAGNOSIS — O09212 Supervision of pregnancy with history of pre-term labor, second trimester: Secondary | ICD-10-CM

## 2013-05-31 DIAGNOSIS — O09219 Supervision of pregnancy with history of pre-term labor, unspecified trimester: Secondary | ICD-10-CM

## 2013-05-31 LAB — POCT URINALYSIS DIP (DEVICE)
Glucose, UA: NEGATIVE mg/dL
Leukocytes, UA: NEGATIVE
Nitrite: NEGATIVE
Protein, ur: NEGATIVE mg/dL
Urobilinogen, UA: 0.2 mg/dL (ref 0.0–1.0)
pH: 5.5 (ref 5.0–8.0)

## 2013-05-31 NOTE — Progress Notes (Signed)
P= 110 Pt. C/o of intermittent right sided lower abdominal/pelvic pain. Edema in feet.  C/o of a poking feeling after walking; pt. Believes its her cerclage; it hurts for her to bend down. Pt. Believes she is also more tired. States all of this has occurred after her having the cerclage.

## 2013-05-31 NOTE — Patient Instructions (Signed)
Cerclaje (Cerclage) El cerclaje cervical es un procedimiento quirrgico para solucionar un cuello uterino incompetente. Un cuello uterino incompetente es un cuello dbil que se abre antes de que comience el trabajo de parto. El cerclaje consiste en cerrar el cuello uterino con una sutura durante el embarazo.  INFORME A SU MDICO:   Cualquier alergia que tenga.  Todos los medicamentos que utiliza, incluyendo vitaminas, hierbas, gotas oftlmicas, cremas y medicamentos de venta libre.  Problemas previos que usted o los miembros de su familia hayan tenido con el uso de anestsicos.  Enfermedades de la sangre.  Cirugas previas.  Padecimientos mdicos.  Resfros o infecciones recientes. RIESGOS Y COMPLICACIONES  Generalmente es un procedimiento seguro. Sin embargo, como en cualquier procedimiento, pueden surgir complicaciones. Las complicaciones posibles son:  Infeccin.  Hemorragias.  Ruptura del saco amnitico (membranas).  Comenzar el trabajo de parto y el parto de forma prematura.  Problemas con la anestesia.  Infeccin del saco amnitico. ANTES DEL PROCEDIMIENTO   Consulte a su mdico si debe cambiar o suspender los medicamentos que toma habitualmente.  No debe comer ni beber nada durante las 6 - 8 horas previas al procedimiento.  Pdale a alguna persona que la lleve a su casa luego del procedimiento. PROCEDIMIENTO   Se le colocar una sonda intravenosa en una de las venas. Le darn un sedante para que pueda relajarse. Le aplicarn un medicamento que la har dormir durante el procedimiento (anestesia general) o le inyectarn un medicamento para adormecer la zona de la cintura para abajo (anestesia espinal) o anestesia epidural. Usted dormir o tendr el cuerpo adormecido durante todo el procedimiento.  Le colocarn un espculo en la vagina para visualizar el cuello uterino.  Luego el cuello se toma y se sutura de manera apretada.  Podrn utilizar una ecografa para  guiar el procedimiento y controlar al beb. DESPUS DEL PROCEDIMIENTO   La llevarn a una sala de recuperacin y controlarn al beb que an no ha nacido. Una vez que despierte, se encuentre estabilizada y pueda ingerir lquidos, podr volver a su habitacin.  Deber permanecer en el hospital durante la noche.  Le aplicarn una inyeccin de progesterona para evitar las contracciones uterinas.  Le darn analgsicos para que tome en su casa.  Pdale a alguna persona que la lleve de vuelta a su casa y que permanezca con usted durante 2 das. Document Released: 09/03/2008 Document Revised: 02/07/2013 ExitCare Patient Information 2014 [employer].  

## 2013-05-31 NOTE — Progress Notes (Signed)
Note reviewed, cervix examined, stitch intact anterior.

## 2013-06-07 ENCOUNTER — Encounter: Payer: Self-pay | Admitting: Family Medicine

## 2013-06-07 ENCOUNTER — Ambulatory Visit (INDEPENDENT_AMBULATORY_CARE_PROVIDER_SITE_OTHER): Payer: Self-pay | Admitting: Family Medicine

## 2013-06-07 VITALS — BP 131/82 | HR 108 | Temp 97.9°F | Wt 161.1 lb

## 2013-06-07 DIAGNOSIS — N898 Other specified noninflammatory disorders of vagina: Secondary | ICD-10-CM

## 2013-06-07 DIAGNOSIS — O26872 Cervical shortening, second trimester: Secondary | ICD-10-CM

## 2013-06-07 DIAGNOSIS — O09219 Supervision of pregnancy with history of pre-term labor, unspecified trimester: Secondary | ICD-10-CM

## 2013-06-07 DIAGNOSIS — O26879 Cervical shortening, unspecified trimester: Secondary | ICD-10-CM

## 2013-06-07 DIAGNOSIS — O09212 Supervision of pregnancy with history of pre-term labor, second trimester: Secondary | ICD-10-CM

## 2013-06-07 NOTE — Addendum Note (Signed)
Addended by: Franchot Mimes on: 06/07/2013 12:11 PM   Modules accepted: Orders

## 2013-06-07 NOTE — Progress Notes (Signed)
Here for 17P shot- c/o pain last 3 days lower pelvis, can't even walk. C/o cramps at times. Due to history preterm delivery and cervical funneling - will have her see provider today.

## 2013-06-07 NOTE — Addendum Note (Signed)
Addended by: Faythe Casa on: 06/07/2013 12:06 PM   Modules accepted: Orders

## 2013-06-07 NOTE — Patient Instructions (Signed)
Segundo trimestre del embarazo  (Second Trimester of Pregnancy) El segundo trimestre del embarazo se extiende desde la semana 13 hasta la semana 28, del 4 al 6 mes. En general, es el momento del embarazo en el que se sentir mejor. Su organismo se ha adaptado a estar embarazada y comienza a sentirse fsicamente mejor. En general las nuseas matutinas han disminuido o han desaparecido completamente. El segundo trimestre es tambin la poca en la que el feto se desarrolla rpidamente. Hacia el final del sexto mes, el beb mide aproximadamente 9 pulgadas (23 cm) y pesa alrededor de 1  libras (700 g). Es probable que sienta mover al beb (dar pataditas) entre las 18 y 20 semanas del embarazo.  CAMBIOS CORPORALES  Su organismo atravesar numerosos cambios durante el embarazo. Los cambios varan de una mujer a otra.   Seguir aumentando de peso. Notar que la parte baja del abdomen se ensancha.  Podrn aparecer las primeras estras en las caderas, abdomen y mamas.  Es posible que sienta cefaleas, que se pueden aliviar con los medicamentos que su mdico le autorice a utilizar.  Tendr necesidad de orinar con ms frecuencia porque el feto est presionando en la vejiga.  Como consecuencia del embarazo, podr sentir acidez estomacal continuamente.  Podr estar constipada ya que ciertas hormonas hacen que los msculos que empujan los desechos a travs de los intestinos trabajen ms lentamente.  Pueden aparecer hemorroides o abultarse las venas (venas varicosas).  El dolor de espalda se debe al aumento de peso y a que las hormonas del embarazo relajan las articulaciones entre los huesos de la pelvis y a la modificacin del peso y a los msculos que soportan el equilibrio.  Sus mamas seguirn desarrollndose y estarn ms sensibles.  Las encas pueden sangrar y estar sensibles al cepillado y al hilo dental.  Pueden aparecer en el rostro zonas oscuras o manchas (cloasma, mscara del embarazo). Esto  desaparece despus del nacimiento del beb.  Es posible que se formeuna lnea oscura desde el ombligo a la zona del pubis (linea nigra). Esto desaparece despus del nacimiento del beb. QU DEBE ESPERAR EN LAS CONSULTAS PRENATALES  Durante una visita prenatal de rutina:   La pesarn para verificar que usted y el feto se encuentran dentro de los lmites normales.  Le tomarn la presin arterial.  Le medirn el abdomen para verificar el desarrollo del beb.  Escucharn los latidos fetales.  Se evaluarn los resultados de los estudios realizados en visitas anteriores. El mdico le preguntar:   Cmo se siente.  Si siente los movimientos del beb.  Si tiene sntomas anormales, como prdida de lquido, sangrado, dolores de cabeza intensos o clicos abdominales.  Si tiene alguna duda. Otros estudios que podrn realizarse durante el segundo trimestre son:   Anlisis de sangre para evaluar:  Niveles bajos de hierro (anemia).  Diabetes gestacional (entre las 24 y las 28 semanas).  Anticuerpos Rh.  Anlisis de orina para detectar infecciones, diabetes o protenas en la orina.  Una ecografa para confirmar si el crecimiento y el desarrollo del beb son los adecuados.  Una amniocentesis para diagnosticar posibles problemas genticos.  Estudios del feto para descartar espina bfida y sndrome de Down. INSTRUCCIONES PARA EL CUIDADO EN EL HOGAR   Evite fumar, consumir hierbas, beber alcohol y utilizar frmacos que no ne hayan recetado. Estas sustancias qumicas afectan la formacin y el desarrollo del beb.  Siga las indicaciones del profesional con respecto a como tomar los medicamentos. Durante el embarazo, hay   medicamentos que son seguros y otros no lo son.  Realice actividad fsica slo segn las indicaciones del mdico. Sentir clicos uterinos es el mejor signo para detener la actividad fsica.  Contine haciendo comidas regulares y sanas.  Use un sostn que le brinde buen  soporte si sus mamas estn sensibles.  No utilice la baera con agua caliente, baos turcos y saunas.  Colquese el cinturn de seguridad cuando conduzca.  Evite comer carne cruda y el contacto con los utensilios y desperdicios de los gatos. Estos elementos contienen grmenes que pueden causar defectos de nacimiento en el beb.  Tome las vitaminas indicadas para la etapa prenatal.  Pruebe un laxante (si el mdico la autoriza) si tiene constipacin. Consuma ms alimentos ricos en fibra, como vegetales y frutas frescos y cereales enteros. Beba gran cantidad de lquido para mantener la orina de tono claro o amarillo plido.  Tome baos de agua tibia para calmar el dolor o las molestias causadas por las hemorroides. Use una crema para las hemorroides si el mdico la autoriza.  Si tiene venas varicosas, use medias de soporte. Eleve los pies durante 15 minutos, 3 o 4 veces por da. Limite el consumo de sal en su dieta.  Evite levantar objetos pesados, use zapatos de tacones bajos y mantenga una buena postura.  Descanse con las piernas elevadas si tiene calambres o dolor de cintura.  Visite a su dentista si no lo ha hecho durante el embarazo. Use un cepillo de dientes blando para higienizarse los dientes y use suavemente el hilo dental.  Puede continuar su vida sexual siempre que el mdico la autorice.  Concurra a todas las visitas prenatales segn las indicaciones de su mdico. SOLICITE ATENCIN MDICA SI:   Tiene mareos.  Siente clicos leves, presin en la pelvis o dolor persistente en el abdomen.  Tiene nuseas o vmitos o diarrea persistentes.  Observa una secrecin vaginal con mal olor.  Siente dolor al orinar. SOLICITE ATENCIN MDICA DE INMEDIATO SI:   Tiene fiebre.  Pierde lquido por la vagina.  Tiene sangrando o pequeas prdidas vaginales.  Siente dolor intenso o clicos en el abdomen.  Sube o baja de peso rpidamente.  Tiene dificultad para respirar y le duele  pecho.  Sbitamente se le hincha el rostro, las manos, los tobillos, los pies o las piernas de manera extrema.  No ha sentido los movimientos del beb durante una hora.  Siente un dolor de cabeza intenso que no se alivia con medicamentos.  Su visin se modifica. Document Released: 03/17/2005 Document Revised: 02/07/2013 ExitCare Patient Information 2014 [employer].  

## 2013-06-07 NOTE — Progress Notes (Signed)
+  FM no vb, no ctx, + White vaginal discharge - watery for last week  Cerclage placed 12/2, on 17P Needs q2week [place] for Cervical length until 28wk  SSE white vaginal discharge, cerclage in place, thick appearing cervix.  Veronica Mccormick is a 32 y.o. [id number] at 86w3dhere for ROB visit.  Discussed with Patient:  -Plans to breast feed.  All questions answered. -Continue prenatal vitamins.  -Reviewed fetal kick counts (Pt to perform daily at a time when the baby is active, lie laterally with both hands on belly in quiet room and count all movements (hiccups, shoulder rolls, obvious kicks, etc); pt is to report to clinic or [facility]U for less than 10 movements felt in a one hour time period-pt told as soon as she counts 10 movements the count is complete.)  - Routine precautions discussed (depression, infection s/s).   Patient provided with all pertinent phone numbers for emergencies. - RTC for any VB, regular, painful cramps/ctxs occurring at a rate of >2/10 min, fever (100.5 or higher), n/v/d, any pain that is unresolving or worsening, LOF, decreased fetal movement, CP, SOB, edema  Problems: Patient Active Problem List   Diagnosis Date Noted  . 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 History of preterm delivery, currently pregnant 04/26/2013  . Pregnancy with history of pre-term labor 04/06/2013  . Pregnancy with other poor obstetric history(V23.49) 04/06/2013    To Do:   [ ]  Vaccines: YQM:[id number]  Tdap:  [ ]  BCM: BTL desires  Edu: [x ] PTL precautions; [ ]  BF class; [ ]  childbirth class; [ ]   BF counseling;

## 2013-06-08 ENCOUNTER — Other Ambulatory Visit: Payer: Self-pay | Admitting: Family Medicine

## 2013-06-08 ENCOUNTER — Ambulatory Visit (HOSPITAL_COMMUNITY)
Admission: RE | Admit: 2013-06-08 | Discharge: 2013-06-08 | Disposition: A | Discharge status: Home / Self Care | Payer: Medicaid Other | Source: Ambulatory Visit | Attending: Family Medicine | Admitting: Family Medicine

## 2013-06-08 DIAGNOSIS — O26872 Cervical shortening, second trimester: Secondary | ICD-10-CM

## 2013-06-08 DIAGNOSIS — O09299 Supervision of pregnancy with other poor reproductive or obstetric history, unspecified trimester: Secondary | ICD-10-CM | POA: Insufficient documentation

## 2013-06-08 DIAGNOSIS — O3430 Maternal care for cervical incompetence, unspecified trimester: Secondary | ICD-10-CM | POA: Insufficient documentation

## 2013-06-08 DIAGNOSIS — Z3689 Encounter for other specified antenatal screening: Secondary | ICD-10-CM | POA: Insufficient documentation

## 2013-06-08 DIAGNOSIS — Z8751 Personal history of pre-term labor: Secondary | ICD-10-CM | POA: Insufficient documentation

## 2013-06-08 LAB — WET PREP, GENITAL
Trich, Wet Prep: NONE SEEN
Yeast Wet Prep HPF POC: NONE SEEN

## 2013-06-13 ENCOUNTER — Ambulatory Visit (INDEPENDENT_AMBULATORY_CARE_PROVIDER_SITE_OTHER): Payer: Self-pay | Admitting: *Deleted

## 2013-06-13 VITALS — BP 113/73 | HR 96 | Temp 98.2°F | Wt 163.6 lb

## 2013-06-13 DIAGNOSIS — O09219 Supervision of pregnancy with history of pre-term labor, unspecified trimester: Secondary | ICD-10-CM

## 2013-06-13 DIAGNOSIS — O09212 Supervision of pregnancy with history of pre-term labor, second trimester: Secondary | ICD-10-CM

## 2013-06-15 ENCOUNTER — Encounter: Payer: Self-pay | Admitting: Family Medicine

## 2013-06-20 ENCOUNTER — Ambulatory Visit: Payer: Self-pay

## 2013-06-21 NOTE — L&D Delivery Note (Signed)
Attestation of Attending Supervision of Advanced Practitioner (CNM/NP): Evaluation and management procedures were performed by the Advanced Practitioner under my supervision and collaboration. I have reviewed the Advanced Practitioner's note and chart, and I agree with the management and plan.  Lesly DukesKelly H Deseri Loss 5:40 PM

## 2013-06-21 NOTE — L&D Delivery Note (Signed)
Delivery Note At 2:31 AM a viable female was delivered via Vaginal, Spontaneous Delivery (Presentation: ; Occiput Anterior).  APGAR: 8, 9; weight: pending.   Placenta status: Intact, Spontaneous.  Cord: 3 vessels with the following complications: None.  Anesthesia: None  Episiotomy: None Lacerations: None Suture Repair: none Est. Blood Loss (mL): 250  Mom to postpartum.  Baby to Couplet care / Skin to Skin.  Upon arrival patient was completely dilated and pushing with good effort. During delivery, noted to have a shoulder cord that was reduced on delivery. Baby delivered without difficulty, was noted to have good tone and place on maternal abdomen for oral suctioning, drying and stimulation. Delayed cord clamping performed and cut by FOB. Placenta delivered intact with 3V cord. Vaginal canal and perineum was inspected and intact without lacerations. Pitocin was started and uterus massaged until bleeding slowed. Counts of sharps, instruments, and lap pads were all correct.    Saralyn PilarAlexander Karamalegos 10/03/2013, 3:01 AM  I have seen and examined this patient and I agree with the above. Arabella MerlesKimberly D Alieyah Spader 8:00 AM 10/03/2013

## 2013-06-25 ENCOUNTER — Ambulatory Visit (INDEPENDENT_AMBULATORY_CARE_PROVIDER_SITE_OTHER): Payer: Self-pay | Admitting: *Deleted

## 2013-06-25 VITALS — BP 120/71 | HR 105 | Temp 97.6°F | Wt 165.9 lb

## 2013-06-25 DIAGNOSIS — O09219 Supervision of pregnancy with history of pre-term labor, unspecified trimester: Secondary | ICD-10-CM

## 2013-06-25 DIAGNOSIS — O09212 Supervision of pregnancy with history of pre-term labor, second trimester: Secondary | ICD-10-CM

## 2013-06-28 ENCOUNTER — Other Ambulatory Visit: Payer: Self-pay | Admitting: Family

## 2013-06-28 ENCOUNTER — Ambulatory Visit (HOSPITAL_COMMUNITY)
Admission: RE | Admit: 2013-06-28 | Discharge: 2013-06-28 | Disposition: A | Discharge status: Home / Self Care | Payer: MEDICAID | Source: Ambulatory Visit | Attending: Family | Admitting: Family

## 2013-06-28 ENCOUNTER — Ambulatory Visit (INDEPENDENT_AMBULATORY_CARE_PROVIDER_SITE_OTHER): Payer: MEDICAID | Admitting: Family

## 2013-06-28 VITALS — BP 122/75 | Temp 98.3°F | Wt 166.2 lb

## 2013-06-28 DIAGNOSIS — Z3689 Encounter for other specified antenatal screening: Secondary | ICD-10-CM | POA: Insufficient documentation

## 2013-06-28 DIAGNOSIS — O09212 Supervision of pregnancy with history of pre-term labor, second trimester: Secondary | ICD-10-CM

## 2013-06-28 DIAGNOSIS — O3430 Maternal care for cervical incompetence, unspecified trimester: Secondary | ICD-10-CM | POA: Insufficient documentation

## 2013-06-28 DIAGNOSIS — Z8751 Personal history of pre-term labor: Secondary | ICD-10-CM | POA: Insufficient documentation

## 2013-06-28 DIAGNOSIS — O26879 Cervical shortening, unspecified trimester: Secondary | ICD-10-CM

## 2013-06-28 DIAGNOSIS — O09299 Supervision of pregnancy with other poor reproductive or obstetric history, unspecified trimester: Secondary | ICD-10-CM | POA: Insufficient documentation

## 2013-06-28 DIAGNOSIS — O09219 Supervision of pregnancy with history of pre-term labor, unspecified trimester: Secondary | ICD-10-CM

## 2013-06-28 DIAGNOSIS — O26872 Cervical shortening, second trimester: Secondary | ICD-10-CM

## 2013-06-28 DIAGNOSIS — O3432 Maternal care for cervical incompetence, second trimester: Secondary | ICD-10-CM

## 2013-06-28 LAB — POCT URINALYSIS DIP (DEVICE)
BILIRUBIN URINE: NEGATIVE
GLUCOSE, UA: NEGATIVE mg/dL
LEUKOCYTES UA: NEGATIVE
NITRITE: NEGATIVE
Protein, ur: 30 mg/dL — AB
Specific Gravity, Urine: >=1.03 (ref 1.005–1.030)
Urobilinogen, UA: 0.2 mg/dL (ref 0.0–1.0)
pH: 5.5 (ref 5.0–8.0)

## 2013-06-28 MED ORDER — BETAMETHASONE SOD PHOS & ACET 6 (3-3) MG/ML IJ SUSP
12.0000 mg | Freq: Once | INTRAMUSCULAR | Status: AC
  Administered 2013-06-28: 12 mg via INTRAMUSCULAR

## 2013-06-28 NOTE — Progress Notes (Signed)
Sent from radiology by Dr. Judeth PorchNietsche due to short cervix, 0.8 cm, for betamethasone injection 12 mg X2. The dx was explained and she will return tomorrow for second injection

## 2013-06-28 NOTE — Progress Notes (Signed)
U/S scheduled in Ultrasound today at 145 pm.(MFM unable to work patient in this week)

## 2013-06-28 NOTE — Progress Notes (Signed)
P= 100 Edema in feet.  C/o of having brown discharge and states this morning it was "a little burning." States they did a wet prep last time but it has gotten worse.   1515 P= 97 Pt. Back to clinic from ultrasound for betamethasone. Pt. Tolerated injection well. Advised pt. That she needs to get the second dose of betamethasone in 24 hours and will therefore need to go to MAU between 3-330 tomorrow 06/29/12 Friday for the second dose. Pt. Verbalized understanding. Called MAU spoke with charge nurse and informed them pt. Would be coming tomorrow. Second order in for betamethasone per her request.

## 2013-06-28 NOTE — Progress Notes (Signed)
Reports brown discharge, slight burning.  No bleeding or abdominal pain.  Exam - white vaginal discharge, no bleeding or brown discharge noted.  Wet prep collected.  Cerclage palpated intact.  Reviewed last ultrasound 12/19 1 cm cervical length, funnel length 2.3 > repeat ultrasound scheduled with MFM.  Received 17-p this past Monday (4 days ago).

## 2013-06-29 ENCOUNTER — Inpatient Hospital Stay (HOSPITAL_COMMUNITY)
Admission: AD | Admit: 2013-06-29 | Discharge: 2013-06-29 | Disposition: A | Discharge status: Home / Self Care | Payer: Medicaid Other | Source: Ambulatory Visit | Attending: Obstetrics & Gynecology | Admitting: Obstetrics & Gynecology

## 2013-06-29 DIAGNOSIS — O4700 False labor before 37 completed weeks of gestation, unspecified trimester: Secondary | ICD-10-CM | POA: Insufficient documentation

## 2013-06-29 LAB — WET PREP, GENITAL
CLUE CELLS WET PREP: NONE SEEN
TRICH WET PREP: NONE SEEN
WBC, Wet Prep HPF POC: NONE SEEN
Yeast Wet Prep HPF POC: NONE SEEN

## 2013-06-29 MED ORDER — BETAMETHASONE SOD PHOS & ACET 6 (3-3) MG/ML IJ SUSP
12.0000 mg | Freq: Once | INTRAMUSCULAR | Status: AC
  Administered 2013-06-29: 12 mg via INTRAMUSCULAR
  Filled 2013-06-29: qty 2

## 2013-06-29 NOTE — MAU Note (Signed)
Pt asking about yesterday's US,? Brown d/c.  And plan.  Reviewed US report, and the documented plan with pt.  Labor precautions and things to watch for explained to pt.  Pt verbalized understanding, has appt in 2 wks, knows to come here if has any water leaking, bright red bleeding or contractions.

## 2013-07-02 ENCOUNTER — Ambulatory Visit: Payer: Self-pay

## 2013-07-03 ENCOUNTER — Ambulatory Visit (HOSPITAL_COMMUNITY)
Admission: RE | Admit: 2013-07-03 | Discharge: 2013-07-03 | Disposition: A | Discharge status: Home / Self Care | Payer: Medicaid Other | Source: Ambulatory Visit | Attending: Family | Admitting: Family

## 2013-07-03 ENCOUNTER — Ambulatory Visit: Payer: Self-pay

## 2013-07-05 ENCOUNTER — Other Ambulatory Visit: Payer: Self-pay | Admitting: Family Medicine

## 2013-07-05 DIAGNOSIS — O26872 Cervical shortening, second trimester: Secondary | ICD-10-CM

## 2013-07-05 DIAGNOSIS — O3430 Maternal care for cervical incompetence, unspecified trimester: Secondary | ICD-10-CM

## 2013-07-05 DIAGNOSIS — O358XX0 Maternal care for other (suspected) fetal abnormality and damage, not applicable or unspecified: Secondary | ICD-10-CM

## 2013-07-10 ENCOUNTER — Ambulatory Visit (HOSPITAL_COMMUNITY)
Admission: RE | Admit: 2013-07-10 | Discharge: 2013-07-10 | Disposition: A | Discharge status: Home / Self Care | Payer: MEDICAID | Source: Ambulatory Visit | Attending: Family | Admitting: Family

## 2013-07-10 ENCOUNTER — Encounter (HOSPITAL_COMMUNITY): Payer: Self-pay

## 2013-07-10 VITALS — BP 121/65 | HR 104 | Wt 170.2 lb

## 2013-07-10 DIAGNOSIS — O3430 Maternal care for cervical incompetence, unspecified trimester: Secondary | ICD-10-CM | POA: Insufficient documentation

## 2013-07-10 DIAGNOSIS — O09899 Supervision of other high risk pregnancies, unspecified trimester: Secondary | ICD-10-CM

## 2013-07-10 DIAGNOSIS — O26872 Cervical shortening, second trimester: Secondary | ICD-10-CM

## 2013-07-10 DIAGNOSIS — Z8751 Personal history of pre-term labor: Secondary | ICD-10-CM | POA: Insufficient documentation

## 2013-07-10 DIAGNOSIS — O3432 Maternal care for cervical incompetence, second trimester: Secondary | ICD-10-CM

## 2013-07-10 DIAGNOSIS — O09299 Supervision of pregnancy with other poor reproductive or obstetric history, unspecified trimester: Secondary | ICD-10-CM | POA: Insufficient documentation

## 2013-07-10 DIAGNOSIS — O09219 Supervision of pregnancy with history of pre-term labor, unspecified trimester: Secondary | ICD-10-CM

## 2013-07-10 DIAGNOSIS — O358XX0 Maternal care for other (suspected) fetal abnormality and damage, not applicable or unspecified: Secondary | ICD-10-CM

## 2013-07-11 ENCOUNTER — Encounter: Payer: Self-pay | Admitting: Family Medicine

## 2013-07-12 ENCOUNTER — Ambulatory Visit (INDEPENDENT_AMBULATORY_CARE_PROVIDER_SITE_OTHER): Payer: Self-pay | Admitting: Family

## 2013-07-12 VITALS — BP 122/79 | Temp 97.7°F | Wt 168.4 lb

## 2013-07-12 DIAGNOSIS — O09899 Supervision of other high risk pregnancies, unspecified trimester: Secondary | ICD-10-CM

## 2013-07-12 DIAGNOSIS — O3430 Maternal care for cervical incompetence, unspecified trimester: Secondary | ICD-10-CM

## 2013-07-12 DIAGNOSIS — O09219 Supervision of pregnancy with history of pre-term labor, unspecified trimester: Secondary | ICD-10-CM

## 2013-07-12 DIAGNOSIS — O3432 Maternal care for cervical incompetence, second trimester: Secondary | ICD-10-CM

## 2013-07-12 LAB — POCT URINALYSIS DIP (DEVICE)
BILIRUBIN URINE: NEGATIVE
Glucose, UA: NEGATIVE mg/dL
Ketones, ur: 15 mg/dL — AB
LEUKOCYTES UA: NEGATIVE
Nitrite: NEGATIVE
PH: 6 (ref 5.0–8.0)
Protein, ur: NEGATIVE mg/dL
Specific Gravity, Urine: 1.025 (ref 1.005–1.030)
Urobilinogen, UA: 0.2 mg/dL (ref 0.0–1.0)

## 2013-07-12 MED ORDER — PROGESTERONE MICRONIZED 200 MG PO CAPS
ORAL_CAPSULE | ORAL | Status: DC
Start: 2013-07-12 — End: 2013-08-20

## 2013-07-12 MED ORDER — HYDROXYZINE PAMOATE 50 MG PO CAPS: 50.0000 mg | ORAL_CAPSULE | Freq: Every day | ORAL | Status: DC

## 2013-07-12 NOTE — Progress Notes (Signed)
Pt expresses a lot of anxiety due to pregnancy and shortened cervix.  Difficulty sleeping.  Reviewed ultrasound from 07/10/13 (cervical length - 0.9 cm).  Provided words of encouragement that this pregnancy is 3 weeks further than her demise and that every day makes a difference.  RX Vistaril and Prometrium to begin nightly (consulted with Dr. Shawnie PonsPratt).  Next cervical length ultrasound scheduled.

## 2013-07-12 NOTE — Progress Notes (Signed)
P= 100 Edema in feet.  C/o of pain in lower right pelvic/hip area, especially at night and while walking.  C/o of having difficulty sleeping and feels like "my head is going to explode;" explains she feels very sleepy and groggy and as if her head is heavy because she is so tired. Tylenol PM does not help. Would like something for sleep.

## 2013-07-19 ENCOUNTER — Ambulatory Visit (INDEPENDENT_AMBULATORY_CARE_PROVIDER_SITE_OTHER): Payer: Self-pay | Admitting: *Deleted

## 2013-07-19 VITALS — BP 115/71 | HR 107 | Temp 97.6°F

## 2013-07-19 DIAGNOSIS — O09899 Supervision of other high risk pregnancies, unspecified trimester: Secondary | ICD-10-CM

## 2013-07-19 DIAGNOSIS — O09219 Supervision of pregnancy with history of pre-term labor, unspecified trimester: Secondary | ICD-10-CM

## 2013-07-19 NOTE — Addendum Note (Signed)
Addended by: Candelaria StagersHAIZLIP, Annaliz [address] on: 07/19/2013 09:37 AM   Modules accepted: Level of Service

## 2013-07-24 ENCOUNTER — Ambulatory Visit (HOSPITAL_COMMUNITY)
Admission: RE | Admit: 2013-07-24 | Discharge: 2013-07-24 | Disposition: A | Discharge status: Home / Self Care | Payer: MEDICAID | Source: Ambulatory Visit | Attending: Obstetrics & Gynecology | Admitting: Obstetrics & Gynecology

## 2013-07-24 VITALS — BP 118/64 | HR 95 | Wt 174.0 lb

## 2013-07-24 DIAGNOSIS — O09299 Supervision of pregnancy with other poor reproductive or obstetric history, unspecified trimester: Secondary | ICD-10-CM

## 2013-07-24 DIAGNOSIS — O09219 Supervision of pregnancy with history of pre-term labor, unspecified trimester: Secondary | ICD-10-CM

## 2013-07-24 DIAGNOSIS — Z8751 Personal history of pre-term labor: Secondary | ICD-10-CM | POA: Insufficient documentation

## 2013-07-24 DIAGNOSIS — O3432 Maternal care for cervical incompetence, second trimester: Secondary | ICD-10-CM

## 2013-07-24 DIAGNOSIS — O09899 Supervision of other high risk pregnancies, unspecified trimester: Secondary | ICD-10-CM

## 2013-07-24 DIAGNOSIS — O3430 Maternal care for cervical incompetence, unspecified trimester: Secondary | ICD-10-CM | POA: Insufficient documentation

## 2013-07-26 ENCOUNTER — Ambulatory Visit (INDEPENDENT_AMBULATORY_CARE_PROVIDER_SITE_OTHER): Payer: Self-pay

## 2013-07-26 VITALS — BP 125/77 | HR 100 | Wt 175.1 lb

## 2013-07-26 DIAGNOSIS — O09219 Supervision of pregnancy with history of pre-term labor, unspecified trimester: Secondary | ICD-10-CM

## 2013-07-26 MED ORDER — ZOLPIDEM TARTRATE 5 MG PO TABS: 5.0000 mg | ORAL_TABLET | Freq: Every evening | ORAL | Status: DC | PRN

## 2013-07-26 NOTE — Progress Notes (Addendum)
Pt. Here today for 17P. Pt. Verbalized concerns about last ultrasound in MFM; states they told her her cervix was 4cm and she was having problems. States she has been having lower abdominal/pelvic pressure and is uncomfortable. Spoke to Dr. Jolayne Pantheronstant who reviewed pt.'s ultrasound from 2/3. Per Dr. Jolayne Pantheronstant the cerclage is doing its job, she will continue with the cerclage and the 17P, her pressure is due do the incompetent cervix and pregnancy. Advised pt. To continue to monitor her body for signs of contractions and increasing pain and lower back pain and bleeding/discharge etc. Pt. Verbalized understanding. Pt. Then stated she is having trouble sleeping, vistaril and tylenol pm tried and not helping. Ambien prescribed for patient. Pt. Will follow up at next visit. Pt. Has no other questions or concerns.   Last dose of 17P vial used. Called [facility] and reordered.

## 2013-07-28 ENCOUNTER — Encounter: Payer: Self-pay | Admitting: Family

## 2013-08-02 ENCOUNTER — Ambulatory Visit (INDEPENDENT_AMBULATORY_CARE_PROVIDER_SITE_OTHER): Payer: Self-pay | Admitting: Obstetrics & Gynecology

## 2013-08-02 ENCOUNTER — Encounter: Payer: Self-pay | Admitting: Obstetrics & Gynecology

## 2013-08-02 VITALS — BP 115/74 | Temp 97.4°F | Wt 175.6 lb

## 2013-08-02 DIAGNOSIS — O09219 Supervision of pregnancy with history of pre-term labor, unspecified trimester: Secondary | ICD-10-CM

## 2013-08-02 DIAGNOSIS — O09899 Supervision of other high risk pregnancies, unspecified trimester: Secondary | ICD-10-CM

## 2013-08-02 LAB — POCT URINALYSIS DIP (DEVICE)
Bilirubin Urine: NEGATIVE
Glucose, UA: NEGATIVE mg/dL
KETONES UR: NEGATIVE mg/dL
Leukocytes, UA: NEGATIVE
Nitrite: NEGATIVE
PROTEIN: NEGATIVE mg/dL
Urobilinogen, UA: 0.2 mg/dL (ref 0.0–1.0)
pH: 5.5 (ref 5.0–8.0)

## 2013-08-02 LAB — CBC
HEMATOCRIT: 32.4 % — AB (ref 36.0–46.0)
Hemoglobin: 10.7 g/dL — ABNORMAL LOW (ref 12.0–15.0)
MCH: 29.4 pg (ref 26.0–34.0)
MCHC: 33 g/dL (ref 30.0–36.0)
MCV: 89 fL (ref 78.0–100.0)
Platelets: 308 10*3/uL (ref 150–400)
RBC: 3.64 MIL/uL — ABNORMAL LOW (ref 3.87–5.11)
RDW: 13.8 % (ref 11.5–15.5)
WBC: 9.2 10*3/uL (ref 4.0–10.5)

## 2013-08-02 NOTE — Patient Instructions (Signed)
Cerclaje (Cerclage) El cerclaje cervical es un procedimiento quirrgico para solucionar un cuello uterino incompetente. Un cuello uterino incompetente es un cuello dbil que se abre antes de que comience el Esterotrabajo de [place]. El cerclaje consiste en cerrar el cuello uterino con una sutura durante el Franklinembarazo.  INFORME A SU MDICO:   Cualquier alergia que tenga.  Todos los Ches[place]ake Energymedicamentos que Grayridgeutiliza, incluyendo vitaminas, hierbas, gotas oftlmicas, cremas y [address]medicamentos de [address]a libre.  Problemas previos que usted o los Graybar Electricmiembros de su familia hayan tenido con el uso de anestsicos.  Enfermedades de Clear Channel Communicationsla sangre.  Cirugas previas.  Padecimientos mdicos.  Resfros o infecciones recientes. RIESGOS Y COMPLICACIONES  Generalmente es un procedimiento seguro. Sin embargo, Tree surgeoncomo en cualquier procedimiento, pueden surgir complicaciones. Las complicaciones posibles son:  Infeccin.  Hemorragias.  Ruptura del saco amnitico (Spring Parkmembranas).  Comenzar el Sport[place] Shorestrabajo de parto y el parto de forma prematura.  Problemas con la anestesia.  Infeccin del saco amnitico. ANTES DEL PROCEDIMIENTO   Consulte a su mdico si debe cambiar o suspender los medicamentos que toma habitualmente.  No debe comer ni beber nada durante las 6 - 8 horas previas al procedimiento.  Pdale a alguna persona que la lleve a su casa luego del procedimiento. PROCEDIMIENTO   Se le colocar una sonda intravenosa en una de las venas. Le darn un sedante para que pueda relajarse. Le aplicarn un medicamento que la har dormir durante el procedimiento (anestesia general) o le inyectarn un medicamento para adormecer la zona de la cintura para abajo (anestesia espinal) o anestesia epidural. Usted dormir o tendr el cuerpo adormecido durante todo el procedimiento.  Le colocarn un espculo en la vagina para visualizar el cuello uterino.  Luego el cuello se toma y se sutura de Reardanmanera apretada.  Podrn utilizar una ecografa para  guiar el procedimiento y Chief Operating Officercontrolar al beb. DESPUS DEL PROCEDIMIENTO   La llevarn a una sala de recuperacin y controlarn al beb que an no ha nacido. Una vez que despierte, se encuentre estabilizada y pueda ingerir lquidos, podr volver a su habitacin.  Deber Engineer, maintenancepermanecer en el hospital durante la noche.  Le aplicarn una inyeccin de progesterona para evitar las contracciones uterinas.  Le darn analgsicos para que tome en su casa.  Pdale a alguna persona que la lleve de vuelta a su casa y que permanezca con usted durante [address]  [address]. Document Released: 09/03/2008 Document Revised: 02/07/2013 [facility] Patient Information 2014 [place] CenterExitCare, [place].

## 2013-08-02 NOTE — Progress Notes (Signed)
17 p today. Occasional pelvic pain and pressure. US 2/3 funneling of cx with intact cerclage seen

## 2013-08-02 NOTE — Progress Notes (Signed)
P= 106 C/o of pain when baby moves and irregular contractions.  Edema in feet.  1hr gtt today, CBC and RPR.

## 2013-08-03 ENCOUNTER — Encounter: Payer: Self-pay | Admitting: Family Medicine

## 2013-08-03 LAB — RPR

## 2013-08-03 LAB — GLUCOSE TOLERANCE, 1 HOUR (50G) W/O FASTING: GLUCOSE 1 HOUR GTT: 194 mg/dL — AB (ref 70–140)

## 2013-08-03 LAB — HIV ANTIBODY (ROUTINE TESTING W REFLEX): HIV: NONREACTIVE

## 2013-08-06 ENCOUNTER — Telehealth: Payer: Self-pay

## 2013-08-06 ENCOUNTER — Other Ambulatory Visit: Payer: Self-pay | Admitting: Family Medicine

## 2013-08-06 DIAGNOSIS — O3430 Maternal care for cervical incompetence, unspecified trimester: Secondary | ICD-10-CM

## 2013-08-06 NOTE — Telephone Encounter (Signed)
Message copied by Louanna RawAMPBELL, Tyshauna Finkbiner M on Mon Aug 06, 2013  3:14 PM ------      Message from: Jolyn LentDOM, MICHAEL R      Created: Fri Aug 03, 2013  9:47 PM       Pt with elevated 1hr glucola - please coordinate 3hr this week.            MRO ------

## 2013-08-06 NOTE — Telephone Encounter (Signed)
Called pt. With Veronica Mccormick and informed her of results. Pt. To be treated as gestational diabetic as glucose 194. Pt. To come Monday 08/13/13 0900 to meet with diabetes educator. Pt. verbalized understanding and  Had no further questions or concerns.

## 2013-08-07 ENCOUNTER — Encounter (HOSPITAL_COMMUNITY): Payer: Self-pay

## 2013-08-07 ENCOUNTER — Ambulatory Visit (HOSPITAL_COMMUNITY)
Admission: RE | Admit: 2013-08-07 | Discharge: 2013-08-07 | Disposition: A | Discharge status: Home / Self Care | Payer: Medicaid Other | Source: Ambulatory Visit | Attending: Obstetrics & Gynecology | Admitting: Obstetrics & Gynecology

## 2013-08-07 VITALS — BP 128/83 | HR 115 | Wt 176.0 lb

## 2013-08-07 DIAGNOSIS — O09299 Supervision of pregnancy with other poor reproductive or obstetric history, unspecified trimester: Secondary | ICD-10-CM

## 2013-08-07 DIAGNOSIS — O09899 Supervision of other high risk pregnancies, unspecified trimester: Secondary | ICD-10-CM

## 2013-08-07 DIAGNOSIS — O09219 Supervision of pregnancy with history of pre-term labor, unspecified trimester: Secondary | ICD-10-CM

## 2013-08-07 DIAGNOSIS — O3430 Maternal care for cervical incompetence, unspecified trimester: Secondary | ICD-10-CM

## 2013-08-07 DIAGNOSIS — Z8751 Personal history of pre-term labor: Secondary | ICD-10-CM | POA: Insufficient documentation

## 2013-08-09 ENCOUNTER — Ambulatory Visit: Payer: Self-pay

## 2013-08-10 ENCOUNTER — Ambulatory Visit: Payer: Self-pay

## 2013-08-13 ENCOUNTER — Encounter: Payer: Medicaid Other | Attending: Obstetrics & Gynecology | Admitting: *Deleted

## 2013-08-13 ENCOUNTER — Other Ambulatory Visit: Payer: Self-pay | Admitting: Family Medicine

## 2013-08-13 ENCOUNTER — Ambulatory Visit (INDEPENDENT_AMBULATORY_CARE_PROVIDER_SITE_OTHER): Payer: Self-pay

## 2013-08-13 VITALS — BP 102/64 | HR 92 | Temp 98.2°F | Wt 176.1 lb

## 2013-08-13 DIAGNOSIS — O09219 Supervision of pregnancy with history of pre-term labor, unspecified trimester: Secondary | ICD-10-CM

## 2013-08-13 MED ORDER — RANITIDINE HCL 150 MG PO TABS: 150.0000 mg | ORAL_TABLET | Freq: Two times a day (BID) | ORAL | Status: DC

## 2013-08-13 NOTE — Progress Notes (Signed)
Pt. Here today for 17P and to meed with Diabetic Educator. 17P given and tolerated well. Pt. Meeting with Diabetic educator currently.

## 2013-08-13 NOTE — Progress Notes (Signed)
  Patient was seen for Gestational Diabetes self-management . Patient has activity restrition.   States the definition of Gestational Diabetes  States why dietary management is important in controlling blood glucose  States when to check blood glucose levels  Demonstrates proper blood glucose monitoring techniques    Plan:  Begin checking BG before breakfast and 1-2 hours after first bit of breakfast, lunch and dinner after  as directed by MD  Take medication  as directed by MD  Blood glucose monitor given: True Track  Lot # [id number] Exp: 04/05/15 Blood glucose reading: 201mg /dl this is 3hpp following OJ , biscuit  Patient instructed to monitor glucose levels: FBS: 60 - <90 1 hour: <140 2 hour: <120  Patient received the following handouts:  Nutrition Diabetes and Pregnancy  Patient will be seen for follow-up as needed.

## 2013-08-13 NOTE — Progress Notes (Signed)
Nutrition note: GDM diet education Pt is a newly diagnosed GDM pt. Pt has gained 42# @ 31w, which is > expected. Pt reports eating 2 meals & 2 snacks/d. Pt is taking PNV. Pt reports no N/V but has heartburn & takes tums, which sometimes helps. Pt reports no physical activity. Pt received verbal & written education in Spanish via interpreter, Raynelle FanningJulie, about GDM diet. Discussed tips to decrease heartburn. Discussed wt gain goals of 15-25# or 0.6#/wk. Pt agrees to follow GDM diet with 2-3 meals & 2-3 snacks/d with proper CHO/ protein combination. F/u in 2-4 wks Blondell RevealLaura Kullen Tomasetti, MS, RD, LDN, [facility] Rehabilitation CenterBCLC

## 2013-08-16 ENCOUNTER — Telehealth: Payer: Self-pay | Admitting: *Deleted

## 2013-08-16 ENCOUNTER — Encounter: Payer: Self-pay | Admitting: Family

## 2013-08-16 NOTE — Telephone Encounter (Signed)
Called to inform about closing due to weather but was unable to leave message on voicemail.

## 2013-08-20 ENCOUNTER — Encounter: Payer: MEDICAID | Attending: Obstetrics & Gynecology | Admitting: *Deleted

## 2013-08-20 ENCOUNTER — Observation Stay (HOSPITAL_COMMUNITY)
Admission: AD | Admit: 2013-08-20 | Discharge: 2013-08-22 | Disposition: A | Discharge status: Home / Self Care | Payer: Medicaid Other | Source: Ambulatory Visit | Attending: Obstetrics & Gynecology | Admitting: Obstetrics & Gynecology

## 2013-08-20 ENCOUNTER — Encounter (HOSPITAL_COMMUNITY): Payer: Self-pay | Admitting: *Deleted

## 2013-08-20 ENCOUNTER — Ambulatory Visit (INDEPENDENT_AMBULATORY_CARE_PROVIDER_SITE_OTHER): Payer: Self-pay

## 2013-08-20 VITALS — BP 101/69 | HR 103 | Temp 97.7°F

## 2013-08-20 DIAGNOSIS — E119 Type 2 diabetes mellitus without complications: Secondary | ICD-10-CM | POA: Insufficient documentation

## 2013-08-20 DIAGNOSIS — O09899 Supervision of other high risk pregnancies, unspecified trimester: Secondary | ICD-10-CM

## 2013-08-20 DIAGNOSIS — O4700 False labor before 37 completed weeks of gestation, unspecified trimester: Principal | ICD-10-CM | POA: Diagnosis present

## 2013-08-20 DIAGNOSIS — O3430 Maternal care for cervical incompetence, unspecified trimester: Secondary | ICD-10-CM | POA: Insufficient documentation

## 2013-08-20 DIAGNOSIS — O09219 Supervision of pregnancy with history of pre-term labor, unspecified trimester: Secondary | ICD-10-CM

## 2013-08-20 DIAGNOSIS — O24919 Unspecified diabetes mellitus in pregnancy, unspecified trimester: Secondary | ICD-10-CM | POA: Insufficient documentation

## 2013-08-20 DIAGNOSIS — N898 Other specified noninflammatory disorders of vagina: Secondary | ICD-10-CM | POA: Insufficient documentation

## 2013-08-20 DIAGNOSIS — O09299 Supervision of pregnancy with other poor reproductive or obstetric history, unspecified trimester: Secondary | ICD-10-CM

## 2013-08-20 DIAGNOSIS — O24913 Unspecified diabetes mellitus in pregnancy, third trimester: Secondary | ICD-10-CM | POA: Insufficient documentation

## 2013-08-20 LAB — URINE MICROSCOPIC-ADD ON

## 2013-08-20 LAB — URINALYSIS, ROUTINE W REFLEX MICROSCOPIC
Bilirubin Urine: NEGATIVE
Glucose, UA: NEGATIVE mg/dL
Ketones, ur: NEGATIVE mg/dL
LEUKOCYTES UA: NEGATIVE
NITRITE: NEGATIVE
Protein, ur: NEGATIVE mg/dL
SPECIFIC GRAVITY, URINE: 1.01 (ref 1.005–1.030)
Urobilinogen, UA: 0.2 mg/dL (ref 0.0–1.0)
pH: 6 (ref 5.0–8.0)

## 2013-08-20 LAB — WET PREP, GENITAL
CLUE CELLS WET PREP: NONE SEEN
Trich, Wet Prep: NONE SEEN
Yeast Wet Prep HPF POC: NONE SEEN

## 2013-08-20 LAB — GROUP B STREP BY PCR: GROUP B STREP BY PCR: NEGATIVE

## 2013-08-20 LAB — OB RESULTS CONSOLE GC/CHLAMYDIA
Chlamydia: NEGATIVE
Gonorrhea: NEGATIVE

## 2013-08-20 MED ORDER — ZOLPIDEM TARTRATE 5 MG PO TABS
5.0000 mg | ORAL_TABLET | Freq: Every evening | ORAL | Status: DC | PRN
  Administered 2013-08-21 (×2): 5 mg via ORAL
  Filled 2013-08-20 (×2): qty 1

## 2013-08-20 MED ORDER — NIFEDIPINE 10 MG PO CAPS: 10.0000 mg | ORAL_CAPSULE | Freq: Once | ORAL | Status: DC

## 2013-08-20 MED ORDER — NIFEDIPINE 10 MG PO CAPS: 30.0000 mg | ORAL_CAPSULE | Freq: Four times a day (QID) | ORAL | Status: DC

## 2013-08-20 MED ORDER — HYDROCODONE-ACETAMINOPHEN 5-325 MG PO TABS
1.0000 | ORAL_TABLET | Freq: Once | ORAL | Status: AC
  Administered 2013-08-20: 1 via ORAL
  Filled 2013-08-20: qty 1

## 2013-08-20 MED ORDER — CALCIUM CARBONATE ANTACID 500 MG PO CHEW: 2.0000 | CHEWABLE_TABLET | ORAL | Status: DC | PRN

## 2013-08-20 MED ORDER — NIFEDIPINE 10 MG PO CAPS
30.0000 mg | ORAL_CAPSULE | Freq: Once | ORAL | Status: AC
  Administered 2013-08-20: 30 mg via ORAL
  Filled 2013-08-20: qty 3

## 2013-08-20 MED ORDER — TERBUTALINE SULFATE 1 MG/ML IJ SOLN: 0.2500 mg | Freq: Once | INTRAMUSCULAR | Status: DC

## 2013-08-20 MED ORDER — PRENATAL MULTIVITAMIN CH
1.0000 | ORAL_TABLET | Freq: Every day | ORAL | Status: DC
  Administered 2013-08-21 – 2013-08-22 (×2): 1 via ORAL
  Filled 2013-08-20 (×2): qty 1

## 2013-08-20 MED ORDER — NIFEDIPINE 10 MG PO CAPS: 10.0000 mg | ORAL_CAPSULE | ORAL | Status: DC

## 2013-08-20 MED ORDER — GLYBURIDE 2.5 MG PO TABS: 2.5000 mg | ORAL_TABLET | Freq: Every day | ORAL | Status: DC

## 2013-08-20 MED ORDER — ACETAMINOPHEN 325 MG PO TABS
650.0000 mg | ORAL_TABLET | ORAL | Status: DC | PRN
  Administered 2013-08-21: 650 mg via ORAL
  Filled 2013-08-20: qty 2

## 2013-08-20 MED ORDER — DOCUSATE SODIUM 100 MG PO CAPS
100.0000 mg | ORAL_CAPSULE | Freq: Every day | ORAL | Status: DC
  Administered 2013-08-21 – 2013-08-22 (×2): 100 mg via ORAL
  Filled 2013-08-20 (×2): qty 1

## 2013-08-20 NOTE — MAU Note (Signed)
Patient state sshe has a cerclage since December.

## 2013-08-20 NOTE — MAU Provider Note (Signed)
Chief Complaint:  Labor Eval and Vaginal Discharge   First Provider Initiated Contact with Patient 08/20/13 1948      HPI: Veronica Mccormick is a 33 y.o. [id number] at 6559w0d who presents to maternity admissions reporting contractions about q10 minutes since 1400 and yellow vaginal discharge since Friday. . Denies leakage of fluid or vaginal bleeding. Good fetal movement. Seen in clinic today and got 17-P  Pregnancy Course: HRC: A2 DM (Rx glyburide today); hx PTB and short cx 0.8 > S/P cerclage, BMZ  Past Medical History: Past Medical History  Diagnosis Date  . Medical history non-contributory   . Depression     Postpartum after fetal demise  . Vaginal delivery 1996, [phone] [id number],2010  . History of preterm delivery, currently pregnant 04/26/2013    17p > funneling to cerclage on 12/19 - repeat US in 2 weeks     Past obstetric history: OB History  Gravida Para Term Preterm AB SAB TAB Ectopic Multiple Living  8 4 3 1 3 2  0 1 0 3    # Outcome Date GA Lbr Len/2nd Weight Sex Delivery Anes PTL Lv  8 CUR           7 PRE 2010 7662w2d  0.34 kg (12 oz)     ND     Comments:  PPROM at 21 wks; arrived 2-3 cm dilated, hospitalized x 4 wks, then SOL  6 SAB 2007             Comments: 4.5 weeks w/ D&C  5 ECT 2006          4 TRM 03/31/03 4443w0d  2.722 kg (6 lb) F SVD None  Y  3 SAB 2002             Comments: 8 weeks  2 TRM 02/15/97 643w0d  3.175 kg (7 lb) M  None  Y  1 TRM 01/29/95 2943w0d  3.175 kg (7 lb) M SVD None  Y      Past Surgical History: Past Surgical History  Procedure Laterality Date  . Breast surgery    . Cervical cerclage N/A 05/22/2013    Procedure: CERCLAGE CERVICAL;  Surgeon: Adam PhenixJames G Arnold, MD;  Location: [facility];  Service: Gynecology;  Laterality: N/A;     Family History: Family History  Problem Relation Age of Onset  . Alcohol abuse Neg Hx   . Arthritis Neg Hx   . Asthma Neg Hx   . Birth defects Neg Hx   . Cancer Neg Hx   . COPD Neg Hx   . Depression Neg Hx   .  Diabetes Neg Hx   . Drug abuse Neg Hx   . Early death Neg Hx   . Hearing loss Neg Hx   . Heart disease Neg Hx   . Hypertension Neg Hx   . Hyperlipidemia Neg Hx   . Kidney disease Neg Hx   . Learning disabilities Neg Hx   . Mental illness Neg Hx   . Mental retardation Neg Hx   . Miscarriages / Stillbirths Neg Hx   . Stroke Neg Hx   . Vision loss Neg Hx     Social History: History  Substance Use Topics  . Smoking status: Never Smoker   . Smokeless tobacco: Never Used  . Alcohol Use: No    Allergies: No Known Allergies  Meds:  Facility-administered medications prior to admission  Medication Dose Route Frequency Provider Last Rate Last Dose  . hydroxyprogesterone caproate (DELALUTIN) 250 mg/mL  injection 250 mg  250 mg Intramuscular Weekly Melissa Noon, CNM   250 mg at 08/20/13 1119   Prescriptions prior to admission  Medication Sig Dispense Refill  . Prenatal Vit-Fe Fumarate-FA (PRENATAL MULTIVITAMIN) TABS tablet Take 1 tablet by mouth daily at 12 noon.      Marland Kitchen zolpidem (AMBIEN) 5 MG tablet Take 1 tablet (5 mg total) by mouth at bedtime as needed for sleep.  30 tablet  0  . glyBURIDE (DIABETA) 2.5 MG tablet Take 1 tablet (2.5 mg total) by mouth at bedtime.  30 tablet  0    ROS: Pertinent findings in history of present illness.  Physical Exam  Blood pressure 120/70, pulse 90, temperature 98.3 F (36.8 C), temperature source Oral, resp. rate 16, height 5' 0.5" (1.537 m), weight 79.107 kg (174 lb 6.4 oz), last menstrual period 01/15/2013, SpO2 99.00%. GENERAL: Well-developed, well-nourished female in no acute distress.  HEENT: normocephalic HEART: normal rate RESP: normal effort ABDOMEN: Soft, non-tender, gravid appropriate for gestational age EXTREMITIES: Nontender, no edema NEURO: alert and oriented SPECULUM EXAM: NEFG, white discharge, no blood, cervix clean, stitch n place    SVE: stitch intact, cx 0.5 cm, cerclage intact  FHT:  Baseline140, moderate  variability, accelerations present, no decelerations Contractions: UI and irregular low amplitude UCs   Labs: Results for orders placed during the hospital encounter of 08/20/13 (from the past 24 hour(s))  URINALYSIS, ROUTINE W REFLEX MICROSCOPIC     Status: Abnormal   Collection Time    08/20/13  6:05 PM      Result Value Ref Range   Color, Urine YELLOW  YELLOW   APPearance CLEAR  CLEAR   Specific Gravity, Urine 1.010  1.005 - 1.030   pH 6.0  5.0 - 8.0   Glucose, UA NEGATIVE  NEGATIVE mg/dL   Hgb urine dipstick TRACE (*) NEGATIVE   Bilirubin Urine NEGATIVE  NEGATIVE   Ketones, ur NEGATIVE  NEGATIVE mg/dL   Protein, ur NEGATIVE  NEGATIVE mg/dL   Urobilinogen, UA 0.2  0.0 - 1.0 mg/dL   Nitrite NEGATIVE  NEGATIVE   Leukocytes, UA NEGATIVE  NEGATIVE  URINE MICROSCOPIC-ADD ON     Status: Abnormal   Collection Time    08/20/13  6:05 PM      Result Value Ref Range   Squamous Epithelial / LPF FEW (*) RARE   WBC, UA 3-6  <3 WBC/hpf   RBC / HPF 3-6  <3 RBC/hpf   Bacteria, UA FEW (*) RARE   Urine-Other MUCOUS PRESENT      Imaging:  [place] Mfm Ob Transvaginal  08/07/2013   OBSTETRICAL ULTRASOUND: This exam was performed within a [facility]. The OB US report was generated in the AS system, and faxed to the ordering physician.   This report is also available in [employer] and in the [employer]. See AS Obstetric US report.  [place] Mfm Ob Transvaginal  07/24/2013   OBSTETRICAL ULTRASOUND: This exam was performed within a [facility]. The OB US report was generated in the AS system, and faxed to the ordering physician.   This report is also available in [employer] and in the [employer]. See AS Obstetric US report.  MAU Course: Care assumed by Rulon Abide at 2000.  Assessment: 1. History of preterm delivery, currently pregnant   2. Pregnancy with history of pre-term labor   3. Pregnancy with other poor  obstetric history(V23.49)      Deirdre  Colin Mulders, CNM 08/20/2013 7:59 PM   Care assumed from [place].  Pt with regular contractions on the monitor and breathing with them.   Given 30mg  of Procardia OTO.  Contractions improved. Then slowly started returning. Second dose of procardia given with improvement.    Will admit to antenatal for monitoring and continued tocolysis. See H&P.   Vale Haven, MD

## 2013-08-20 NOTE — MAU Note (Signed)
This nurse unaware of patient assignment.  RN name on grease board in error.

## 2013-08-20 NOTE — H&P (Signed)
Antenatal History and Physical  HPI: Veronica Mccormick is a 33 y.o. [phone] at [redacted]w[redacted]d who presents to maternity admissions reporting contractions about q10 minutes since 1400 and yellow vaginal discharge since Friday. .  Denies leakage of fluid or vaginal bleeding. Good fetal movement. Seen in clinic today and got 17-P    Pregnancy Course: HRC: A2 DM (Rx glyburide today); hx PTB and short cx 0.8 > S/P cerclage, BMZ x2 on 1/2 and 1/3  Past Medical History:  Past Medical History   Diagnosis  Date   .  Medical history non-contributory    .  Depression      Postpartum after fetal demise   .  Vaginal delivery  1996, [phone]   .  History of preterm delivery, currently pregnant  04/26/2013     17p > funneling to cerclage on 12/19 - repeat US in 2 weeks    Past obstetric history:  OB History   Gravida  Para  Term  Preterm  AB  SAB  TAB  Ectopic  Multiple  Living   8  4  3  1  3  2   0  1  0  3     #  Outcome  Date  GA  Lbr Len/2nd  Weight  Sex  Delivery  Anes  PTL  Lv   8  CUR            7  PRE  2010  [redacted]w[redacted]d   0.34 kg (12 oz)      ND   Comments: PPROM at 21 wks; arrived 2-3 cm dilated, hospitalized x 4 wks, then SOL   6  SAB  2007           Comments: 4.5 weeks w/ D&C   5  ECT  2006           4  TRM  03/31/03  [redacted]w[redacted]d   2.722 kg (6 lb)  F  SVD  None   Y   3  SAB  2002           Comments: 8 weeks   2  TRM  02/15/97  [redacted]w[redacted]d   3.175 kg (7 lb)  M   None   Y   1  TRM  01/29/95  [redacted]w[redacted]d   3.175 kg (7 lb)  M  SVD  None   Y      Past Surgical History:  Past Surgical History   Procedure  Laterality  Date   .  Breast surgery     .  Cervical cerclage  N/A  05/22/2013     Procedure: CERCLAGE CERVICAL; Surgeon: Adam Phenix, MD; Location: [facility]; Service: Gynecology; Laterality: N/A;    Family History:  Family History   Problem  Relation  Age of Onset   .  Alcohol abuse  Neg Hx    .  Arthritis  Neg Hx    .  Asthma  Neg Hx    .  Birth defects  Neg Hx    .  Cancer  Neg Hx    .  COPD   Neg Hx    .  Depression  Neg Hx    .  Diabetes  Neg Hx    .  Drug abuse  Neg Hx    .  Early death  Neg Hx    .  Hearing loss  Neg Hx    .  Heart disease  Neg Hx    .  Hypertension  Neg Hx    .  Hyperlipidemia  Neg Hx    .  Kidney disease  Neg Hx    .  Learning disabilities  Neg Hx    .  Mental illness  Neg Hx    .  Mental retardation  Neg Hx    .  Miscarriages / Stillbirths  Neg Hx    .  Stroke  Neg Hx    .  Vision loss  Neg Hx     Social History:  History   Substance Use Topics   .  Smoking status:  Never Smoker   .  Smokeless tobacco:  Never Used   .  Alcohol Use:  No    Allergies: No Known Allergies   Meds:  Facility-administered medications prior to admission   Medication  Dose  Route  Frequency  Provider  Last Rate  Last Dose   .  hydroxyprogesterone caproate (DELALUTIN) 250 mg/mL injection 250 mg  250 mg  Intramuscular  Weekly  Walidah N Muhammad, CNM   250 mg at 08/20/13 1119    Prescriptions prior to admission   Medication  Sig  Dispense  Refill   .  Prenatal Vit-Fe Fumarate-FA (PRENATAL MULTIVITAMIN) TABS tablet  Take 1 tablet by mouth daily at 12 noon.     Marland Kitchen  zolpidem (AMBIEN) 5 MG tablet  Take 1 tablet (5 mg total) by mouth at bedtime as needed for sleep.  30 tablet  0   .  glyBURIDE (DIABETA) 2.5 MG tablet  Take 1 tablet (2.5 mg total) by mouth at bedtime.  30 tablet  0    ROS: Pertinent findings in history of present illness.   Physical Exam   Blood pressure 120/70, pulse 90, temperature 98.3 F (36.8 C), temperature source Oral, resp. rate 16, height 5' 0.5" (1.537 m), weight 79.107 kg (174 lb 6.4 oz), last menstrual period 01/15/2013, SpO2 99.00%.  GENERAL: Well-developed, well-nourished female in no acute distress.  HEENT: normocephalic  HEART: normal rate  RESP: normal effort  ABDOMEN: Soft, non-tender, gravid appropriate for gestational age  EXTREMITIES: Nontender, no edema  NEURO: alert and oriented  SPECULUM EXAM: NEFG, white discharge, no blood,  cervix clean, stitch n place  SVE: stitch intact, cx 0.5 cm, cerclage intact  FHT: Baseline140, moderate variability, accelerations present, no decelerations  Contractions: time of UI and other times with contractions every 2-24min   Labs:  Results for orders placed during the hospital encounter of 08/20/13 (from the past 24 hour(s))   URINALYSIS, ROUTINE W REFLEX MICROSCOPIC Status: Abnormal    Collection Time    08/20/13 6:05 PM   Result  Value  Ref Range    Color, Urine  YELLOW  YELLOW    APPearance  CLEAR  CLEAR    Specific Gravity, Urine  1.010  1.005 - 1.030    pH  6.0  5.0 - 8.0    Glucose, UA  NEGATIVE  NEGATIVE mg/dL    Hgb urine dipstick  TRACE (*)  NEGATIVE    Bilirubin Urine  NEGATIVE  NEGATIVE    Ketones, ur  NEGATIVE  NEGATIVE mg/dL    Protein, ur  NEGATIVE  NEGATIVE mg/dL    Urobilinogen, UA  0.2  0.0 - 1.0 mg/dL    Nitrite  NEGATIVE  NEGATIVE    Leukocytes, UA  NEGATIVE  NEGATIVE   URINE MICROSCOPIC-ADD ON Status: Abnormal    Collection Time    08/20/13 6:05 PM   Result  Value  Ref Range    Squamous Epithelial / LPF  FEW (*)  RARE    WBC, UA  3-6  <3 WBC/hpf    RBC / HPF  3-6  <3 RBC/hpf    Bacteria, UA  FEW (*)  RARE    Urine-Other  MUCOUS PRESENT     ASSESSMENT The primary encounter diagnosis was Threatened preterm labor, antepartum. Diagnoses of History of preterm delivery, currently pregnant, Pregnancy with history of pre-term labor, Pregnancy with other poor obstetric history(V23.49), and Cervical cerclage suture present were also pertinent to this visit.  Contractions initially q2 min but improved to q6 min and less strong with 2 doses of procardia in MAU.    PLAN  1) Admit to Antenatal - routine admission orders.   2) threatened PTL - cont procardia for tocolysis - pt 32 weeks so Mag not indicated - BMZ x2 already given - if determined to be labor, will need cerclage cut  3) A2DM - ACHS checks - will hold glyburide for now pending glucose  readings  4) FWB - cat I tracing - GBS unk- will send PCR  5) cont close monitoring on TOCO  Graylon Amory L, MD

## 2013-08-20 NOTE — MAU Note (Signed)
Patient states she is having contractions every 10 minutes with a yellow thick vaginal discharge. Denies bleeding and reports good fetal movement.

## 2013-08-20 NOTE — Progress Notes (Signed)
Diabetes: Patients was seen last week by CDE and Dietitian. She has been testing her glucose and notes that she has been complying with dietary guidelines. In review of her glucose reading FBS 1:7 are WNL, 2hpp all meals 4:7 withing compliance. After discussion with Dr. Reola CalkinsBeck Will begin Glyberide 2.5mg  hs. Order transmitted for Dr. Reola CalkinsBeck . Patient Verbalized understanding. Will returnin one week for review of glucose readings.

## 2013-08-21 ENCOUNTER — Encounter (HOSPITAL_COMMUNITY): Payer: Self-pay | Admitting: *Deleted

## 2013-08-21 DIAGNOSIS — O4700 False labor before 37 completed weeks of gestation, unspecified trimester: Secondary | ICD-10-CM

## 2013-08-21 LAB — CBC
HCT: 30.1 % — ABNORMAL LOW (ref 36.0–46.0)
Hemoglobin: 10 g/dL — ABNORMAL LOW (ref 12.0–15.0)
MCH: 28.7 pg (ref 26.0–34.0)
MCHC: 33.2 g/dL (ref 30.0–36.0)
MCV: 86.2 fL (ref 78.0–100.0)
Platelets: 286 10*3/uL (ref 150–400)
RBC: 3.49 MIL/uL — ABNORMAL LOW (ref 3.87–5.11)
RDW: 14 % (ref 11.5–15.5)
WBC: 10.4 10*3/uL (ref 4.0–10.5)

## 2013-08-21 LAB — GLUCOSE, CAPILLARY
GLUCOSE-CAPILLARY: 100 mg/dL — AB (ref 70–99)
GLUCOSE-CAPILLARY: 90 mg/dL (ref 70–99)
Glucose-Capillary: 144 mg/dL — ABNORMAL HIGH (ref 70–99)
Glucose-Capillary: 84 mg/dL (ref 70–99)

## 2013-08-21 LAB — GC/CHLAMYDIA PROBE AMP
CT Probe RNA: NEGATIVE
GC Probe RNA: NEGATIVE

## 2013-08-21 LAB — ABO/RH: ABO/RH(D): A POS

## 2013-08-21 LAB — TYPE AND SCREEN
ABO/RH(D): A POS
Antibody Screen: NEGATIVE

## 2013-08-21 MED ORDER — TERBUTALINE SULFATE 1 MG/ML IJ SOLN
0.2500 mg | Freq: Once | INTRAMUSCULAR | Status: AC
  Administered 2013-08-21: 0.25 mg via SUBCUTANEOUS
  Filled 2013-08-21: qty 1

## 2013-08-21 MED ORDER — BUTALBITAL-APAP-CAFFEINE 50-325-40 MG PO TABS: 1.0000 | ORAL_TABLET | ORAL | Status: DC | PRN

## 2013-08-21 MED ORDER — NIFEDIPINE ER 30 MG PO TB24
30.0000 mg | ORAL_TABLET | Freq: Two times a day (BID) | ORAL | Status: DC
  Administered 2013-08-21 – 2013-08-22 (×2): 30 mg via ORAL
  Filled 2013-08-21 (×2): qty 1

## 2013-08-21 MED ORDER — NIFEDIPINE 10 MG PO CAPS: 10.0000 mg | ORAL_CAPSULE | ORAL | Status: DC | PRN

## 2013-08-21 NOTE — Progress Notes (Signed)
Patient ID: Veronica Mccormick, female   DOB: Nov 01, 1980, 33 y.o.   MRN: [id number] ACULTY PRACTICE ANTEPARTUM COMPREHENSIVE PROGRESS NOTE  Veronica Mccormick is a 33 y.o. [id number] at [redacted]w[redacted]d  who is admitted for Preterm labor, in pt with a h/i cervical insufficiency.   Fetal presentation is cephalic. Length of Stay:  1  Days  Subjective: Pt reports that she has a HA but, denies abd or pelvic pain.    Patient reports good fetal movement.  She reports no uterine contractions, no bleeding and no loss of fluid per vagina.  Vitals:  Blood pressure 97/53, pulse 94, temperature 97.8 F (36.6 C), temperature source Oral, resp. rate 24, height 5' 0.5" (1.537 m), weight 174 lb 6.4 oz (79.107 kg), last menstrual period 01/15/2013, SpO2 99.00%. Physical Examination: General appearance - alert, well appearing, and in no distress Abdomen - soft, nontender, nondistended, no masses or organomegaly graivd Extremities - no pedal edema noted Cervical Exam: Not evaluated. Last pm the cerclage was noted to be in place.  The head was not well applied and there was no pressure on the stich thus, it was left in place  Membranes:intact  Fetal Monitoring:  Baseline: 140's-150's bpm, Variability: Good {> 6 bpm), Accelerations: Reactive and toco only occ ctx at present  Labs:  Results for orders placed during the hospital encounter of 08/20/13 (from the past 24 hour(s))  URINALYSIS, ROUTINE W REFLEX MICROSCOPIC   Collection Time    08/20/13  6:05 PM      Result Value Ref Range   Color, Urine YELLOW  YELLOW   APPearance CLEAR  CLEAR   Specific Gravity, Urine 1.010  1.005 - 1.030   pH 6.0  5.0 - 8.0   Glucose, UA NEGATIVE  NEGATIVE mg/dL   Hgb urine dipstick TRACE (*) NEGATIVE   Bilirubin Urine NEGATIVE  NEGATIVE   Ketones, ur NEGATIVE  NEGATIVE mg/dL   Protein, ur NEGATIVE  NEGATIVE mg/dL   Urobilinogen, UA 0.2  0.0 - 1.0 mg/dL   Nitrite NEGATIVE  NEGATIVE   Leukocytes, UA NEGATIVE  NEGATIVE  URINE  MICROSCOPIC-ADD ON   Collection Time    08/20/13  6:05 PM      Result Value Ref Range   Squamous Epithelial / LPF FEW (*) RARE   WBC, UA 3-6  <3 WBC/hpf   RBC / HPF 3-6  <3 RBC/hpf   Bacteria, UA FEW (*) RARE   Urine-Other MUCOUS PRESENT    GC/CHLAMYDIA PROBE AMP   Collection Time    08/20/13  7:59 PM      Result Value Ref Range   CT Probe RNA NEGATIVE  NEGATIVE   GC Probe RNA NEGATIVE  NEGATIVE  WET PREP, GENITAL   Collection Time    08/20/13  7:59 PM      Result Value Ref Range   Yeast Wet Prep HPF POC NONE SEEN  NONE SEEN   Trich, Wet Prep NONE SEEN  NONE SEEN   Clue Cells Wet Prep HPF POC NONE SEEN  NONE SEEN   WBC, Wet Prep HPF POC FEW (*) NONE SEEN  GROUP B STREP BY PCR   Collection Time    08/20/13 10:46 PM      Result Value Ref Range   Group B strep by PCR NEGATIVE  NEGATIVE  CBC   Collection Time    08/21/13  5:45 AM      Result Value Ref Range   WBC 10.4  4.0 - 10.5 K/uL  RBC 3.49 (*) 3.87 - 5.11 MIL/uL   Hemoglobin 10.0 (*) 12.0 - 15.0 g/dL   HCT 04.530.1 (*) 40.936.0 - 81.146.0 %   MCV 86.2  78.0 - 100.0 fL   MCH 28.7  26.0 - 34.0 pg   MCHC 33.2  30.0 - 36.0 g/dL   RDW 91.414.0  78.211.5 - 95.615.5 %   Platelets 286  150 - 400 K/uL  TYPE AND SCREEN   Collection Time    08/21/13  5:45 AM      Result Value Ref Range   ABO/RH(D) A POS     Antibody Screen NEG     Sample Expiration 08/24/2013      Imaging Studies:    n/a   Medications:  Scheduled . docusate sodium  100 mg Oral Daily  . NIFEdipine  10 mg Oral Once  . prenatal multivitamin  1 tablet Oral Q1200  . terbutaline  0.25 mg Subcutaneous Once   I have reviewed the patient's current medications.  ASSESSMENT: Patient Active Problem List   Diagnosis Date Noted  . Diabetes mellitus complicating pregnancy in third trimester, antepartum 08/20/2013  . Threatened premature labor 08/20/2013  . 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 History of preterm delivery, currently pregnant  04/26/2013  . Pregnancy with history of pre-term labor 04/06/2013  . Pregnancy with other poor obstetric history(V23.49) 04/06/2013    PLAN: Keep Nifedipne 10mg  q 4 hours Watch for s/sx of returning PTL Fioricet prn HA  Continue routine antenatal care.   HARRAWAY-SMITH, Ikeem Cleckler 08/21/2013,9:27 AM

## 2013-08-21 NOTE — H&P (Signed)
Pt seen and examined.  Her cervix is slightly posterior.  The cerclage is not under tension and the head is not applied to the cervix.    Attestation of Attending Supervision of Fellow: Evaluation and management procedures were performed by the Fellow under my supervision and collaboration.  I have reviewed the Fellow's note and chart, and I agree with the management and plan.

## 2013-08-21 NOTE — Progress Notes (Signed)
UR completed 

## 2013-08-21 NOTE — MAU Provider Note (Signed)
Attestation of Attending Supervision of Advanced Practitioner (CNM/NP): Evaluation and management procedures were performed by the Advanced Practitioner under my supervision and collaboration.  I have reviewed the Advanced Practitioner's note and chart, and I agree with the management and plan.  HARRAWAY-SMITH, Donnie Gedeon 12:08 AM

## 2013-08-22 LAB — GLUCOSE, CAPILLARY: GLUCOSE-CAPILLARY: 77 mg/dL (ref 70–99)

## 2013-08-22 MED ORDER — NIFEDIPINE ER 30 MG PO TB24: 30.0000 mg | ORAL_TABLET | Freq: Two times a day (BID) | ORAL | Status: DC

## 2013-08-22 MED ORDER — ZOLPIDEM TARTRATE 5 MG PO TABS: 5.0000 mg | ORAL_TABLET | Freq: Every evening | ORAL | Status: DC | PRN

## 2013-08-22 NOTE — Discharge Instructions (Signed)
Cerclaje (Cerclage) El cerclaje cervical es un procedimiento quirrgico para solucionar un cuello uterino incompetente. Un cuello uterino incompetente es un cuello dbil que se abre antes de que comience el [place]. El cerclaje consiste en cerrar el cuello uterino con una sutura durante el [place].  INFORME A SU MDICO:   Cualquier alergia que tenga.  Todos los [employer], incluyendo vitaminas, hierbas, gotas oftlmicas, cremas y [address] de [address].  Problemas previos que usted o los [employer] de su familia hayan tenido con el uso de anestsicos.  Enfermedades de [employer].  Cirugas previas.  Padecimientos mdicos.  Resfros o infecciones recientes. RIESGOS Y COMPLICACIONES  Generalmente es un procedimiento seguro. Sin embargo, Tree surgeon procedimiento, pueden surgir complicaciones. Las complicaciones posibles son:  Infeccin.  Hemorragias.  Ruptura del saco amnitico ([place]).  Comenzar el [place] de parto y el parto de forma prematura.  Problemas con la anestesia.  Infeccin del saco amnitico. ANTES DEL PROCEDIMIENTO   Consulte a su mdico si debe cambiar o suspender los medicamentos que toma habitualmente.  No debe comer ni beber nada durante las 6 - 8 horas previas al procedimiento.  Pdale a alguna persona que la lleve a su casa luego del procedimiento. PROCEDIMIENTO   Se le colocar una sonda intravenosa en una de las venas. Le darn un sedante para que pueda relajarse. Le aplicarn un medicamento que la har dormir durante el procedimiento (anestesia general) o le inyectarn un medicamento para adormecer la zona de la cintura para abajo (anestesia espinal) o anestesia epidural. Usted dormir o tendr el cuerpo adormecido durante todo el procedimiento.  Le colocarn un espculo en la vagina para visualizar el cuello uterino.  Luego el cuello se toma y se sutura de [place] apretada.  Podrn utilizar una ecografa para  guiar el procedimiento y Chief Operating Officer al beb. DESPUS DEL PROCEDIMIENTO   La llevarn a una sala de recuperacin y controlarn al beb que an no ha nacido. Una vez que despierte, se encuentre estabilizada y pueda ingerir lquidos, podr volver a su habitacin.  Deber Engineer, maintenance hospital durante la noche.  Le aplicarn una inyeccin de progesterona para evitar las contracciones uterinas.  Le darn analgsicos para que tome en su casa.  Pdale a alguna persona que la lleve de vuelta a su casa y que permanezca con usted durante [address]  [address]. Document Released: 09/03/2008 Document Revised: 02/07/2013 [facility] Patient Information 2014 [place], [place]. Informacin sobre Government social research officer  (Preterm Labor Information) El parto prematuro comienza antes de la semana 37 de [place]. La duracin de un embarazo normal es de 39 a 41 semanas.  CAUSAS  Generalmente no hay una causa que pueda identificarse del motivo por el que una mujer comienza un trabajo de parto prematuro. Sin embargo, una de las causas conocidas ms frecuentes son las infecciones. Las infecciones del tero, el cuello, la vagina, el lquido [place], la vejiga, los riones y Teacher, adult education de los pulmones (neumona) pueden hacer que el trabajo de parto se inicie. Otras causas que pueden sospecharse son:   Infecciones urogenitales, como infecciones por hongos y vaginosis bacteriana.   Anormalidades uterinas (forma del tero, sptum uterino, fibromas, hemorragias en la placenta).   Un cuello que ha sido operado (puede ser que no permanezca cerrado).   Malformaciones del feto.   Gestaciones mltiples (mellizos, trillizos y ms).   Ruptura del saco amnitico.  FACTORES DE RIESGO   Historia previa de parto prematuro.   Tener ruptura prematura de las membranas (RPM).  La placenta cubre la abertura del cuello (placenta previa).   La placenta se separa del tero (abrupcin placentaria).   El cuello es demasiado dbil para  contener al beb en el tero (cuello incompetente).   Hay mucho lquido en el saco amnitico (polihidramnios).   Consumo de drogas o hbito de fumar durante Firefighterel embarazo.   No aumentar de peso lo suficiente durante el [place].   Mujeres menores de 18 aos o mayores de [address] [place] aos.   Nivel socioeconmico bajo.   Pertenecer a Engineer, productionla raza afroamericana. SNTOMAS  Los signos y sntomas del trabajo de parto prematuro son:   Public librarianClicos similares a los Designer, jewellerymenstruales, dolor abdominal o dolor de espalda.  Contracciones uterinas regulares, tan frecuentes como seis por hora, sin importar su intensidad (pueden ser suaves o dolorosas).  Contracciones que comienzan en la parte superior del tero y se expanden hacia abajo, a la zona inferior del abdomen y la espalda.   Sensacin de aumento de presin en la pelvis.   Aparece una secrecin acuosa o sanguinolenta por la vagina.  TRATAMIENTO  Segn el tiempo del embarazo y otras Oronocircunstancias, el mdico puede indicar reposo en cama. Si es necesario, le indicarn medicamentos para TEFL teacherdetener las contracciones y para Customer service managermadurar los pulmones del feto. Si el trabajo de parto se inicia antes de las 34 semanas de [place]azo, se recomienda la hospitalizacin. El tratamiento depende de las condiciones en que se encuentren usted y el feto.  QU DEBE HACER SI PIENSA QUE EST EN TRABAJO DE PARTO PREMATURO?  Comunquese con su mdico inmediatamente. Debe concurrir al hospital para ser controlada inmediatamente.  CMO PUEDE EVITAR EL TRABAJO DE PARTO PREMATURO EN FUTUROS EMBARAZOS?  Usted debe:   Si fuma, abandonar el hbito.  Mantener un peso saludable y evitar sustancias qumicas y drogas innecesarias.  Controlar todo tipo de infeccin.  Informe a su mdico si tiene una historia conocida de trabajo de parto prematuro. Document Released: 09/14/2007 Document Revised: 02/07/2013 [facility] Patient Information 2014 [place], [place].

## 2013-08-22 NOTE — Progress Notes (Signed)
Patient ID: Veronica Mccormick, female   DOB: 01-08-1981, 33 y.o.   MRN: [id number] FACULTY PRACTICE ANTEPARTUM COMPREHENSIVE PROGRESS NOTE  Veronica Mccormick is a 33 y.o. [id number] at 2380w2d  who is admitted for Preterm labor, in pt with a h/i cervical insufficiency.   Fetal presentation is cephalic. Length of Stay:  2  Days  Subjective: Pt reports 1 contraction during the day which was intense. She is very afraid to go home    Patient reports good fetal movement.  She reports no uterine contractions, no bleeding and no loss of fluid per vagina.  Vitals:  Blood pressure 107/65, pulse 102, temperature 97.6 F (36.4 C), temperature source Oral, resp. rate 18, height 5' 0.5" (1.537 m), weight 174 lb 6.4 oz (79.107 kg), last menstrual period 01/15/2013, SpO2 99.00%. Physical Examination: General appearance - alert, well appearing, and in no distress Abdomen - soft, nontender, graivd Extremities - no pedal edema noted Cervical Exam: Not evaluated. Last pm the cerclage was noted to be in place.  The head was not well applied and there was no pressure on the stich thus, it was left in place  Membranes:intact  Fetal Monitoring:  Baseline: 130 bpm, Variability: Good {> 6 bpm), Accelerations: Reactive and Decelerations: Absent [place]: uterine irritability Labs:  Results for orders placed during the hospital encounter of 08/20/13 (from the past 24 hour(s))  GLUCOSE, CAPILLARY   Collection Time    08/21/13  9:41 AM      Result Value Ref Range   Glucose-Capillary 144 (*) 70 - 99 mg/dL   Comment 1 Documented in Chart    GLUCOSE, CAPILLARY   Collection Time    08/21/13 11:53 AM      Result Value Ref Range   Glucose-Capillary 100 (*) 70 - 99 mg/dL  GLUCOSE, CAPILLARY   Collection Time    08/21/13  5:54 PM      Result Value Ref Range   Glucose-Capillary 84  70 - 99 mg/dL  GLUCOSE, CAPILLARY   Collection Time    08/21/13 11:00 PM      Result Value Ref Range   Glucose-Capillary 90  70 -  99 mg/dL    Imaging Studies:    n/a   Medications:  Scheduled . docusate sodium  100 mg Oral Daily  . NIFEdipine  10 mg Oral Once  . NIFEdipine  30 mg Oral BID  . prenatal multivitamin  1 tablet Oral Q1200  . terbutaline  0.25 mg Subcutaneous Once   I have reviewed the patient's current medications.  ASSESSMENT: Patient Active Problem List   Diagnosis Date Noted  . Diabetes mellitus complicating pregnancy in third trimester, antepartum 08/20/2013  . Threatened premature labor 08/20/2013  . 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 History of preterm delivery, currently pregnant 04/26/2013  . Pregnancy with history of pre-term labor 04/06/2013  . Pregnancy with other poor obstetric history(V23.49) 04/06/2013    PLAN: Continue tocolysis with procardia Watch for s/sx of returning PTL Fioricet prn HA  Continue routine antenatal care. Discussed possible discharge tomorrow if stable   Javon Snee 08/22/2013,7:12 AM

## 2013-08-22 NOTE — Discharge Summary (Signed)
Physician Discharge Summary  Patient ID: Veronica LoganMaribel Rivera Mccormick MRN: [id number] DOB/AGE: [birth year] 33 y.o.  Admit date: 08/20/2013 Discharge date: 08/22/2013  Admission Diagnoses:threatened preterm labor at 32 weeks with cerclage  Discharge Diagnoses: same Active Problems:   Threatened premature labor   Discharged Condition: good  Hospital Course: Admitted with s/sx of possible preterm labor at 32 weeks with cerclage in place with diagnosis of incompetent cervix  Consults: None  Significant Diagnostic Studies:   Treatments: terbutaline, procardia, betamethasone  Discharge Exam: Blood pressure 107/62, pulse 92, temperature 98.1 F (36.7 C), temperature source Oral, resp. rate 18, height 5' 0.5" (1.537 m), weight 172 lb 1.6 oz (78.064 kg), last menstrual period 01/15/2013, SpO2 99.00%. General appearance: alert, cooperative and no distress GI: gravid not tender  Disposition: 01-Home or Self Care   Future Appointments Provider Department Dept Phone   08/27/2013 11:00 AM Vale HavenKeli L Beck, MD [facility] [phone]       Medication List         glyBURIDE 2.5 MG tablet  Commonly known as:  DIABETA  Take 1 tablet (2.5 mg total) by mouth at bedtime.     NIFEdipine 30 MG 24 hr tablet  Commonly known as:  PROCARDIA-XL/ADALAT CC  Take 1 tablet (30 mg total) by mouth 2 (two) times daily.     prenatal multivitamin Tabs tablet  Take 1 tablet by mouth daily at 12 noon.     zolpidem 5 MG tablet  Commonly known as:  AMBIEN  Take 1 tablet (5 mg total) by mouth at bedtime as needed for sleep.           Follow-up Information   Follow up with [facility] In 5 days.      Signed: ARNOLD,JAMES 08/22/2013, 9:19 AM

## 2013-08-22 NOTE — Progress Notes (Signed)
Pt. Is stable and ready to be discharged home. All belongings are with patient. Discharge instructions and prescriptions given and reviewed. All questions answered. Patient education printed in Spanish. Pt.'s sister at bedside to help with understanding. Pt. Wheeled out via wheelchair to sister's car. Pt. Will follow up with MD appointment on Monday.

## 2013-08-22 NOTE — Care Management Note (Signed)
    Page 1 of 1   08/22/2013     11:28:17 AM   CARE MANAGEMENT NOTE 08/22/2013  Patient:  Veronica Mccormick,Veronica Mccormick   Account Number:  [id number]  Date Initiated:  08/22/2013  Documentation initiated by:  Emilio MathGAINES,Tiffanyann Deroo  Subjective/Objective Assessment:   self pay     [facility]Plan[facility]   Anticipated DC Date:  08/22/2013   Anticipated DC Plan:  HOME/SELF CARE      DC Planning Services  CM consult  MATCH Program      Choice offered to / List presented to:             Status of service:  Completed, signed off Medicare Important Message given?   (If response is "NO", the following Medicare IM given date fields will be blank) Date Medicare IM given:   Date Additional Medicare IM given:    Discharge Disposition:  HOME/SELF CARE  Per UR Regulation:    If discussed at Long Length of Stay Meetings, dates discussed:    Comments:  08/22/13 11:00 L. Floyce StakesGaines San [facility] HospitalRNC BSN # [phone]- CM consult for medication needs. Patient is being discharged today and is self pay.  Medication at discharge order is for procardia 30 mg xl bid.  This medicine is not on the $4 [facility] and cost is around 70$.  [facility] and cost will be $3 when patient picks up medicines.  Patient verablized understanding and no other needs.

## 2013-08-27 ENCOUNTER — Encounter: Payer: Self-pay | Admitting: Family Medicine

## 2013-08-27 ENCOUNTER — Ambulatory Visit (INDEPENDENT_AMBULATORY_CARE_PROVIDER_SITE_OTHER): Payer: Self-pay | Admitting: Family Medicine

## 2013-08-27 VITALS — BP 122/79 | Temp 97.0°F | Wt 174.4 lb

## 2013-08-27 DIAGNOSIS — O09899 Supervision of other high risk pregnancies, unspecified trimester: Secondary | ICD-10-CM

## 2013-08-27 DIAGNOSIS — O3430 Maternal care for cervical incompetence, unspecified trimester: Secondary | ICD-10-CM

## 2013-08-27 DIAGNOSIS — O09219 Supervision of pregnancy with history of pre-term labor, unspecified trimester: Secondary | ICD-10-CM

## 2013-08-27 DIAGNOSIS — O24913 Unspecified diabetes mellitus in pregnancy, third trimester: Secondary | ICD-10-CM

## 2013-08-27 DIAGNOSIS — O3432 Maternal care for cervical incompetence, second trimester: Secondary | ICD-10-CM

## 2013-08-27 DIAGNOSIS — O09299 Supervision of pregnancy with other poor reproductive or obstetric history, unspecified trimester: Secondary | ICD-10-CM

## 2013-08-27 DIAGNOSIS — O24919 Unspecified diabetes mellitus in pregnancy, unspecified trimester: Secondary | ICD-10-CM

## 2013-08-27 DIAGNOSIS — E119 Type 2 diabetes mellitus without complications: Secondary | ICD-10-CM

## 2013-08-27 LAB — POCT URINALYSIS DIP (DEVICE)
Bilirubin Urine: NEGATIVE
Glucose, UA: NEGATIVE mg/dL
Ketones, ur: NEGATIVE mg/dL
NITRITE: NEGATIVE
PH: 5.5 (ref 5.0–8.0)
Protein, ur: NEGATIVE mg/dL
Specific Gravity, Urine: 1.025 (ref 1.005–1.030)
UROBILINOGEN UA: 0.2 mg/dL (ref 0.0–1.0)

## 2013-08-27 NOTE — Progress Notes (Signed)
S: 33 yo [id number] @ 6160w0d here for ROBV - doing ok, still having irregular  Contractions and feeling some pulling but improved from when she was admitted.  - was hospitalized on 3/2 and got bmz x2 for possible PTL - overall doing well.  +FM. No vb, lof.   O: see flowsheet  A/P 1) preterm contractions - overall improved - no indication of worsening and no bleeding.  - return precautions discussed but no indication for intervention today.   2) DM - on glyburide 2.5mg  - doing well. Fasting all <90 - pp <120   F/u in 2 weeks.

## 2013-08-27 NOTE — Progress Notes (Signed)
Pulse- 78  Edema-feet Pt reports going to MAU on last Monday for contractions 10 min apart and yellowish discharge

## 2013-09-03 ENCOUNTER — Ambulatory Visit (INDEPENDENT_AMBULATORY_CARE_PROVIDER_SITE_OTHER): Payer: Self-pay | Admitting: General Practice

## 2013-09-03 VITALS — BP 115/76 | HR 100 | Temp 97.6°F | Ht 60.0 in | Wt 173.6 lb

## 2013-09-03 DIAGNOSIS — O09219 Supervision of pregnancy with history of pre-term labor, unspecified trimester: Secondary | ICD-10-CM

## 2013-09-10 ENCOUNTER — Encounter: Payer: Self-pay | Admitting: Family Medicine

## 2013-09-10 ENCOUNTER — Ambulatory Visit (INDEPENDENT_AMBULATORY_CARE_PROVIDER_SITE_OTHER): Payer: Self-pay | Admitting: Family Medicine

## 2013-09-10 VITALS — BP 113/72 | Temp 97.0°F | Wt 174.5 lb

## 2013-09-10 DIAGNOSIS — E119 Type 2 diabetes mellitus without complications: Secondary | ICD-10-CM

## 2013-09-10 DIAGNOSIS — O09219 Supervision of pregnancy with history of pre-term labor, unspecified trimester: Secondary | ICD-10-CM

## 2013-09-10 DIAGNOSIS — O09899 Supervision of other high risk pregnancies, unspecified trimester: Secondary | ICD-10-CM

## 2013-09-10 DIAGNOSIS — O24913 Unspecified diabetes mellitus in pregnancy, third trimester: Secondary | ICD-10-CM

## 2013-09-10 DIAGNOSIS — O24919 Unspecified diabetes mellitus in pregnancy, unspecified trimester: Secondary | ICD-10-CM

## 2013-09-10 LAB — POCT URINALYSIS DIP (DEVICE)
Bilirubin Urine: NEGATIVE
Glucose, UA: NEGATIVE mg/dL
Hgb urine dipstick: NEGATIVE
KETONES UR: NEGATIVE mg/dL
NITRITE: NEGATIVE
PH: 6 (ref 5.0–8.0)
PROTEIN: NEGATIVE mg/dL
Specific Gravity, Urine: 1.02 (ref 1.005–1.030)
Urobilinogen, UA: 0.2 mg/dL (ref 0.0–1.0)

## 2013-09-10 MED ORDER — ZOLPIDEM TARTRATE 5 MG PO TABS: 5.0000 mg | ORAL_TABLET | Freq: Every evening | ORAL | Status: DC | PRN

## 2013-09-10 NOTE — Addendum Note (Signed)
Addended by: Jill SideAY, DIANE L on: 09/10/2013 12:21 PM   Modules accepted: Orders

## 2013-09-10 NOTE — Progress Notes (Signed)
Pulse: 97

## 2013-09-10 NOTE — Addendum Note (Signed)
Addended by: Minta BalsamDOM, Caitlin Ainley R on: 09/10/2013 11:34 AM   Modules accepted: Orders

## 2013-09-10 NOTE — Progress Notes (Signed)
NST: 140s mod var, mult accels  >15x15, no decels Ctx 1 over 24 min  Reactive and reassuring NST

## 2013-09-10 NOTE — Progress Notes (Signed)
+  FM, no LOF, no vb, no ctx Feels pulling sensation  Reports normal sugars. No log today On glyburide 2.5 qpm. Start NST/AFI, growth scan q4wks F/u in 1 week for glucose monitoring  Cerclage in place - d/c at 36wks  Veronica Mccormick is a 33 y.o. [id number] at 4563w0d here for ROB visit.  Discussed with Patient:  -Plans to breast feed.  All questions answered. -Continue prenatal vitamins. -Reviewed fetal kick counts Pt to perform daily at a time when the baby is active, lie laterally with both hands on belly in quiet room and count all movements (hiccups, shoulder rolls, obvious kicks, etc); pt is to report to clinic L&D for less than 10 movements felt in a one hour time period-pt told as soon as she counts 10 movements the count is complete.  - Routine precautions discussed (depression, infection s/s).   Patient provided with all pertinent phone numbers for emergencies. - RTC for any VB, regular, painful cramps/ctxs occurring at a rate of >2/10 min, fever (100.5 or higher), n/v/d, any pain that is unresolving or worsening, LOF, decreased fetal movement, CP, SOB, edema - RTC in 1 weeks for next appt.  Problems: Patient Active Problem List   Diagnosis Date Noted  . Diabetes mellitus complicating pregnancy in third trimester, antepartum 08/20/2013  . Threatened premature labor 08/20/2013  . 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 History of preterm delivery, currently pregnant 04/26/2013  . Pregnancy with history of pre-term labor 04/06/2013  . Pregnancy with other poor obstetric history(V23.49) 04/06/2013    To Do: 1. Tdap will be given today  [ ]  Vaccines: complete today [ ]  BCM: Desires BTL [ ]  Readiness: baby has a place to sleep, car seat, other baby necessities.  Edu: [ ]  PTL precautions; [ ]  BF class; [ ]  childbirth class; [ ]   BF counseling;

## 2013-09-10 NOTE — Patient Instructions (Signed)
Tercer trimestre del embarazo  (Third Trimester of Pregnancy) El tercer trimestre del embarazo abarca desde la semana 29 hasta la semana 42, desde el 7 mes hasta el 9. En este trimestre el feto se desarrolla muy rpidamente. Hacia el final del noveno mes, el beb que an no ha nacido mide alrededor de 20 pulgadas (45 cm) de largo y pesa entre 6 y 10 libras (2,700 y 4,500 kg).  CAMBIOS CORPORALES  Su organismo atravesar numerosos cambios durante el embarazo. Los cambios varan de una mujer a otra.   Seguir aumentando de peso. Es esperable que aumente entre 25 y 35 libras (11 16 kg) hacia el final del embarazo.  Podrn aparecer las primeras estras en las caderas, abdomen y mamas.  Tendr necesidad de orinar con ms frecuencia porque el feto baja hacia la pelvis y presiona en la vejiga.  Como consecuencia del embarazo, podr sentir acidez estomacal continuamente.  Podr estar constipada ya que ciertas hormonas hacen que los msculos que hacen progresar los desechos a travs de los intestinos trabajen ms lentamente.  Pueden aparecer hemorroides o abultarse e hincharse las venas (venas varicosas).  Podr sentir dolor plvico debido al aumento de peso ya que las hormonas del embarazo relajan las articulaciones entre los huesos de la pelvis. El dolor de espalda puede ser consecuencia de la exigencia de los msculos que soportan la postura.  Sus mamas seguirn desarrollndose y estarn ms sensibles. A veces sale una secrecin amarilla de las mamas, que se llama calostro.  El ombligo puede salir hacia afuera.  Podr sentir que le falta el aire debido a que se expande el tero.  Podr notar que el feto "baja" o que se siente ms bajo en el abdomen.  Podr tener una prdida de secrecin mucosa con sangre. Esto suele ocurrir entre unos pocos das y una semana antes del parto.  El cuello se vuelve delgado y blando (se borra) cerca de la fecha de parto. QU DEBE ESPERAR EN LAS CONSULTAS  PRENATALES  Le harn exmenes prenatales cada 2 semanas hasta la semana 36. A partir de ese momento le harn exmenes semanales. Durante una visita prenatal de rutina:   La pesarn para verificar que usted y el feto se encuentran dentro de los lmites normales.  Le tomarn la presin arterial.  Le medirn el abdomen para verificar el desarrollo del beb.  Escucharn los latidos fetales.  Se evaluarn los resultados de los estudios solicitados en visitas anteriores.  Le controlarn el cuello del tero cuando est prxima la fecha de parto para ver si se ha borrado. Alrededor de la semana 36 el mdico controlar el cuello del tero. Al mismo tiempo realizar un anlisis de las secreciones del tejido vaginal. Este examen es para determinar si hay un tipo de bacteria, estreptococo Grupo B. El mdico le explicar esto con ms detalle.  El mdico podr preguntarle:   Como le gustara que fuera el parto.  Cmo se siente.  Si siente los movimientos del beb.  Si tiene sntomas anormales, como prdida de lquido, sangrado, dolores de cabeza intenso o clicos abdominales.  Si tiene alguna duda. Otros estudios que podrn realizarse durante el tercer trimestre son:   Anlisis de sangre para controlar sus niveles de hierro (anemia).  Controles fetales para determinar su salud, el nivel de actividad y su desarrollo. Si tiene alguna enfermedad o si tuvo problemas durante el embarazo, le harn estudios. FALSO TRABAJO DE PARTO  Es posible que sienta contracciones pequeas e irregulares que finalmente   desaparecen. Se llaman contracciones de Braxton Hicks o falso trabajo de parto. Las contracciones pueden durar horas, das o an semanas antes de que el verdadero trabajo de parto se inicie. Si las contracciones tienen intervalos regulares, se intensifican o se hacen dolorosas, lo mejor es que la revise su mdico.  SIGNOS DE TRABAJO DE PARTO   Espasmos del tipo menstrual.  Contracciones cada 5  minutos o menos.  Contracciones que comienzan en la parte superior del tero y se expanden hacia abajo, a la zona inferior del abdomen y la espalda.  Sensacin de presin que aumenta en la pelvis o dolor en la espalda.  Aparece una secrecin acuosa o sanguinolenta por la vagina. Si tiene alguno de estos signos antes de la semana 37 del embarazo, llame a su mdico inmediatamente. Debe concurrir al hospital para ser controlada inmediatamente.  INSTRUCCIONES PARA EL CUIDADO EN EL HOGAR   Evite fumar, consumir hierbas, beber alcohol y utilizar frmacos que no le hayan recetado. Estas sustancias qumicas afectan la formacin y el desarrollo del beb.  Siga las indicaciones del profesional con respecto a como tomar los medicamentos. Durante el embarazo, hay medicamentos que son seguros y otros no lo son.  Realice actividad fsica slo segn las indicaciones del mdico. Sentir clicos uterinos es el mejor signo para detener la actividad fsica.  Contine haciendo comidas regulares y sanas.  Use un sostn que le brinde buen soporte si sus mamas estn sensibles.  No utilice la baera con agua caliente, baos turcos o saunas.  Colquese el cinturn de seguridad cuando conduzca.  Evite comer carne cruda queso sin cocinar y el contacto con los utensilios y desperdicios de los gatos. Estos elementos contienen grmenes que pueden causar defectos de nacimiento en el beb.  Tome las vitaminas indicadas para la etapa prenatal.  Pruebe un laxante (si el mdico la autoriza) si tiene constipacin. Consuma ms alimentos ricos en fibra, como vegetales y frutas frescos y cereales enteros. Beba gran cantidad de lquido para mantener la orina de tono claro o amarillo plido.  Tome baos de agua tibia para calmar el dolor o las molestias causadas por las hemorroides. Use una crema para las hemorroides si el mdico la autoriza.  Si tiene venas varicosas, use medias de soporte. Eleve los pies durante 15 minutos,  3 4 veces por da. Limite el consumo de sal en su dieta.  Evite levantar objetos pesados, use zapatos de tacones bajos y mantenga una buena postura.  Descanse con las piernas elevadas si tiene calambres o dolor de cintura.  Visite a su dentista si no lo ha hecho durante el embarazo. Use un cepillo de dientes blando para higienizarse los dientes y use suavemente el hilo dental.  Puede continuar su vida sexual excepto que el mdico le indique otra cosa.  No haga viajes largos excepto que sea absolutamente necesario y slo con la aprobacin de su mdico.  Tome clases prenatales para entender, practicar y hacer preguntas sobre el trabajo de parto y el alumbramiento.  Haga un ensayo sobre la partida al hospital.  Prepare el bolso que llevar al hospital.  Prepare la habitacin del beb.  Contine concurriendo a todas las visitas prenatales segn las indicaciones de su mdico. SOLICITE ATENCIN MDICA SI:   No est segura si est en trabajo de parto o ha roto la bolsa de aguas.  Tiene mareos.  Siente clicos leves, presin en la pelvis o dolor persistente en el abdomen.  Tiene nuseas o vmitos persistentes.  Observa una   secrecin vaginal con mal olor.  Siente dolor al orinar. SOLICITE ATENCIN MDICA DE INMEDIATO SI:   Tiene fiebre.  Pierde lquido o sangre por la vagina.  Tiene sangrado o pequeas prdidas vaginales.  Siente dolor intenso o clicos en el abdomen.  Sube o baja de peso rpidamente.  Le falta el aire y le duele el pecho al respirar.  Sbitamente se le hincha el rostro, las manos, los tobillos, los pies o las piernas de manera extrema.  No ha sentido los movimientos del beb durante una hora.  Siente un dolor de cabeza intenso que no se alivia con medicamentos.  Su visin se modifica. Document Released: 03/17/2005 Document Revised: 02/07/2013 [employer] Patient Information 2014 [employer].  

## 2013-09-13 ENCOUNTER — Ambulatory Visit (INDEPENDENT_AMBULATORY_CARE_PROVIDER_SITE_OTHER): Payer: Self-pay | Admitting: *Deleted

## 2013-09-13 VITALS — BP 127/67

## 2013-09-13 DIAGNOSIS — O24919 Unspecified diabetes mellitus in pregnancy, unspecified trimester: Secondary | ICD-10-CM

## 2013-09-13 DIAGNOSIS — O24913 Unspecified diabetes mellitus in pregnancy, third trimester: Secondary | ICD-10-CM

## 2013-09-13 DIAGNOSIS — E119 Type 2 diabetes mellitus without complications: Secondary | ICD-10-CM

## 2013-09-13 LAB — US OB FOLLOW UP

## 2013-09-13 NOTE — Progress Notes (Signed)
P = 104   Pt reports that she had leakage of fluid this morning @ 0530.  She had wet panties and a wet spot on her bed. No leakage of fluid since getting up.  Report given to Dr. Macon LargeAnyanwu of pt c/o of fluid leaking early this morning as well as NST/AFI results.  Pt advised to return to hospital if she has fluid leaking again or for decreased FM. Pt voiced understanding.  Interpreter - Nettie ElmSylvia present for entire visit.

## 2013-09-13 NOTE — Progress Notes (Signed)
NST performed today was reviewed and was found to be reactive.  Normal AFI of 14.6 cm.  Continue recommended antenatal testing and prenatal care.

## 2013-09-17 ENCOUNTER — Encounter: Payer: Self-pay | Admitting: Obstetrics & Gynecology

## 2013-09-17 ENCOUNTER — Ambulatory Visit (INDEPENDENT_AMBULATORY_CARE_PROVIDER_SITE_OTHER): Payer: Self-pay | Admitting: Family Medicine

## 2013-09-17 VITALS — BP 113/68 | Temp 97.1°F | Wt 175.0 lb

## 2013-09-17 DIAGNOSIS — O09219 Supervision of pregnancy with history of pre-term labor, unspecified trimester: Secondary | ICD-10-CM

## 2013-09-17 DIAGNOSIS — Z23 Encounter for immunization: Secondary | ICD-10-CM

## 2013-09-17 DIAGNOSIS — E119 Type 2 diabetes mellitus without complications: Secondary | ICD-10-CM

## 2013-09-17 DIAGNOSIS — O24913 Unspecified diabetes mellitus in pregnancy, third trimester: Secondary | ICD-10-CM

## 2013-09-17 DIAGNOSIS — O24919 Unspecified diabetes mellitus in pregnancy, unspecified trimester: Secondary | ICD-10-CM

## 2013-09-17 DIAGNOSIS — O09899 Supervision of other high risk pregnancies, unspecified trimester: Secondary | ICD-10-CM

## 2013-09-17 LAB — POCT URINALYSIS DIP (DEVICE)
BILIRUBIN URINE: NEGATIVE
Glucose, UA: NEGATIVE mg/dL
Ketones, ur: NEGATIVE mg/dL
Leukocytes, UA: NEGATIVE
NITRITE: NEGATIVE
Protein, ur: NEGATIVE mg/dL
Specific Gravity, Urine: >=1.03 (ref 1.005–1.030)
Urobilinogen, UA: 0.2 mg/dL (ref 0.0–1.0)
pH: 6 (ref 5.0–8.0)

## 2013-09-17 LAB — OB RESULTS CONSOLE GBS: GBS: NEGATIVE

## 2013-09-17 MED ORDER — TETANUS-DIPHTH-ACELL PERTUSSIS 5-2.5-18.5 LF-MCG/0.5 IM SUSP: 0.5000 mL | Freq: Once | INTRAMUSCULAR | Status: DC

## 2013-09-17 MED ORDER — HYDROXYPROGESTERONE CAPROATE 250 MG/ML IM OIL
250.0000 mg | TOPICAL_OIL | Freq: Once | INTRAMUSCULAR | Status: AC
  Administered 2013-09-17: 250 mg via INTRAMUSCULAR

## 2013-09-17 NOTE — Progress Notes (Signed)
P=101,

## 2013-09-17 NOTE — Patient Instructions (Signed)
Third Trimester of Pregnancy  The third trimester is from week 29 through week 42, months 7 through 9. The third trimester is a time when the fetus is growing rapidly. At the end of the ninth month, the fetus is about 20 inches in length and weighs 6 10 pounds.   BODY CHANGES  Your body goes through many changes during pregnancy. The changes vary from woman to woman.    Your weight will continue to increase. You can expect to gain 25 35 pounds (11 16 kg) by the end of the pregnancy.   You may begin to get stretch marks on your hips, abdomen, and breasts.   You may urinate more often because the fetus is moving lower into your pelvis and pressing on your bladder.   You may develop or continue to have heartburn as a result of your pregnancy.   You may develop constipation because certain hormones are causing the muscles that push waste through your intestines to slow down.   You may develop hemorrhoids or swollen, bulging veins (varicose veins).   You may have pelvic pain because of the weight gain and pregnancy hormones relaxing your joints between the bones in your pelvis. Back aches may result from over exertion of the muscles supporting your posture.   Your breasts will continue to grow and be tender. A yellow discharge may leak from your breasts called colostrum.   Your belly button may stick out.   You may feel short of breath because of your expanding uterus.   You may notice the fetus "dropping," or moving lower in your abdomen.   You may have a bloody mucus discharge. This usually occurs a few days to a week before labor begins.   Your cervix becomes thin and soft (effaced) near your due date.  WHAT TO EXPECT AT YOUR PRENATAL EXAMS   You will have prenatal exams every 2 weeks until week 36. Then, you will have weekly prenatal exams. During a routine prenatal visit:   You will be weighed to make sure you and the fetus are growing normally.   Your blood pressure is taken.   Your abdomen will be  measured to track your baby's growth.   The fetal heartbeat will be listened to.   Any test results from the previous visit will be discussed.   You may have a cervical check near your due date to see if you have effaced.  At around 36 weeks, your caregiver will check your cervix. At the same time, your caregiver will also perform a test on the secretions of the vaginal tissue. This test is to determine if a type of bacteria, Group B streptococcus, is present. Your caregiver will explain this further.  Your caregiver may ask you:   What your birth plan is.   How you are feeling.   If you are feeling the baby move.   If you have had any abnormal symptoms, such as leaking fluid, bleeding, severe headaches, or abdominal cramping.   If you have any questions.  Other tests or screenings that may be performed during your third trimester include:   Blood tests that check for low iron levels (anemia).   Fetal testing to check the health, activity level, and growth of the fetus. Testing is done if you have certain medical conditions or if there are problems during the pregnancy.  FALSE LABOR  You may feel small, irregular contractions that eventually go away. These are called Braxton Hicks contractions, or   false labor. Contractions may last for hours, days, or even weeks before true labor sets in. If contractions come at regular intervals, intensify, or become painful, it is best to be seen by your caregiver.   SIGNS OF LABOR    Menstrual-like cramps.   Contractions that are 5 minutes apart or less.   Contractions that start on the top of the uterus and spread down to the lower abdomen and back.   A sense of increased pelvic pressure or back pain.   A watery or bloody mucus discharge that comes from the vagina.  If you have any of these signs before the 37th week of pregnancy, call your caregiver right away. You need to go to the hospital to get checked immediately.  HOME CARE INSTRUCTIONS    Avoid all  smoking, herbs, alcohol, and unprescribed drugs. These chemicals affect the formation and growth of the baby.   Follow your caregiver's instructions regarding medicine use. There are medicines that are either safe or unsafe to take during pregnancy.   Exercise only as directed by your caregiver. Experiencing uterine cramps is a good sign to stop exercising.   Continue to eat regular, healthy meals.   Wear a good support bra for breast tenderness.   Do not use hot tubs, steam rooms, or saunas.   Wear your seat belt at all times when driving.   Avoid raw meat, uncooked cheese, cat litter boxes, and soil used by cats. These carry germs that can cause birth defects in the baby.   Take your prenatal vitamins.   Try taking a stool softener (if your caregiver approves) if you develop constipation. Eat more high-fiber foods, such as fresh vegetables or fruit and whole grains. Drink plenty of fluids to keep your urine clear or pale yellow.   Take warm sitz baths to soothe any pain or discomfort caused by hemorrhoids. Use hemorrhoid cream if your caregiver approves.   If you develop varicose veins, wear support hose. Elevate your feet for 15 minutes, 3 4 times a day. Limit salt in your diet.   Avoid heavy lifting, wear low heal shoes, and practice good posture.   Rest a lot with your legs elevated if you have leg cramps or low back pain.   Visit your dentist if you have not gone during your pregnancy. Use a soft toothbrush to brush your teeth and be gentle when you floss.   A sexual relationship may be continued unless your caregiver directs you otherwise.   Do not travel far distances unless it is absolutely necessary and only with the approval of your caregiver.   Take prenatal classes to understand, practice, and ask questions about the labor and delivery.   Make a trial run to the hospital.   Pack your hospital bag.   Prepare the baby's nursery.   Continue to go to all your prenatal visits as directed  by your caregiver.  SEEK MEDICAL CARE IF:   You are unsure if you are in labor or if your water has broken.   You have dizziness.   You have mild pelvic cramps, pelvic pressure, or nagging pain in your abdominal area.   You have persistent nausea, vomiting, or diarrhea.   You have a bad smelling vaginal discharge.   You have pain with urination.  SEEK IMMEDIATE MEDICAL CARE IF:    You have a fever.   You are leaking fluid from your vagina.   You have spotting or bleeding from your vagina.     You have severe abdominal cramping or pain.   You have rapid weight loss or gain.   You have shortness of breath with chest pain.   You notice sudden or extreme swelling of your face, hands, ankles, feet, or legs.   You have not felt your baby move in over an hour.   You have severe headaches that do not go away with medicine.   You have vision changes.  Document Released: 06/01/2001 Document Revised: 02/07/2013 Document Reviewed: 08/08/2012  [employer] Patient Information 2014 [employer].

## 2013-09-17 NOTE — Progress Notes (Signed)
Fasting 79/65/80/73/81/62/67 2HB 88/66/79/70/85/71 2HL 82/70/79/69/79/80 2HD 70/78/80/78/90/81  Glyburide 2.5mg  QPM All sugars at goal. No change in medications, Growth US  Hx of PTL - cerclage - remove at 37wks 17p - last shot today.   Normal FM, no lof since last week nml AFI, no vb, no ctx  Veronica Mccormick is a 33 y.o. [id number] at 439w0d by R=10 here for ROB visit.  Discussed with Patient:  -Plans to breast feed.  All questions answered. -Continue prenatal vitamins. -Reviewed fetal kick counts Pt to perform daily at a time when the baby is active, lie laterally with both hands on belly in quiet room and count all movements (hiccups, shoulder rolls, obvious kicks, etc); pt is to report to clinic L&D for less than 10 movements felt in a one hour time period-pt told as soon as she counts 10 movements the count is complete.  - Routine precautions discussed (depression, infection s/s).   Patient provided with all pertinent phone numbers for emergencies. - RTC for any VB, regular, painful cramps/ctxs occurring at a rate of >2/10 min, fever (100.5 or higher), n/v/d, any pain that is unresolving or worsening, LOF, decreased fetal movement, CP, SOB, edema - RTC in 1 weeks for next appt. - Did GBS swabs today and will f/u results and call if abnormal.   Problems: Patient Active Problem List   Diagnosis Date Noted  . Diabetes mellitus complicating pregnancy in third trimester, antepartum 08/20/2013  . Threatened premature labor 08/20/2013  . 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 History of preterm delivery, currently pregnant 04/26/2013  . Pregnancy with history of pre-term labor 04/06/2013  . Pregnancy with other poor obstetric history(V23.49) 04/06/2013    To Do: 1. Labor bag packed  [x ] Vaccines: refuses [ x] BCM: mirena if unable to get tubal [ x] Readiness: baby has a place to sleep, car seat, other baby necessities.  Edu: [ x] PTL  precautions;

## 2013-09-19 LAB — CULTURE, STREPTOCOCCUS GRP B W/SUSCEPT

## 2013-09-20 ENCOUNTER — Other Ambulatory Visit: Payer: Self-pay

## 2013-09-24 ENCOUNTER — Ambulatory Visit (INDEPENDENT_AMBULATORY_CARE_PROVIDER_SITE_OTHER): Payer: Self-pay | Admitting: Family Medicine

## 2013-09-24 ENCOUNTER — Encounter: Payer: Medicaid Other | Attending: Family Medicine | Admitting: *Deleted

## 2013-09-24 ENCOUNTER — Encounter: Payer: Self-pay | Admitting: Family Medicine

## 2013-09-24 VITALS — BP 120/76 | Temp 97.9°F | Wt 176.0 lb

## 2013-09-24 DIAGNOSIS — O24919 Unspecified diabetes mellitus in pregnancy, unspecified trimester: Secondary | ICD-10-CM

## 2013-09-24 DIAGNOSIS — E119 Type 2 diabetes mellitus without complications: Secondary | ICD-10-CM

## 2013-09-24 DIAGNOSIS — O24913 Unspecified diabetes mellitus in pregnancy, third trimester: Secondary | ICD-10-CM

## 2013-09-24 LAB — POCT URINALYSIS DIP (DEVICE)
BILIRUBIN URINE: NEGATIVE
GLUCOSE, UA: NEGATIVE mg/dL
Ketones, ur: NEGATIVE mg/dL
NITRITE: NEGATIVE
Protein, ur: NEGATIVE mg/dL
Specific Gravity, Urine: 1.02 (ref 1.005–1.030)
UROBILINOGEN UA: 0.2 mg/dL (ref 0.0–1.0)
pH: 5.5 (ref 5.0–8.0)

## 2013-09-24 LAB — HEMOGLOBIN A1C
HEMOGLOBIN A1C: 5.7 % — AB (ref <5.7)
MEAN PLASMA GLUCOSE: 117 mg/dL — AB (ref <117)

## 2013-09-24 MED ORDER — ACCU-CHEK FASTCLIX LANCETS MISC: 1.0000 | Freq: Four times a day (QID) | Status: DC

## 2013-09-24 MED ORDER — GLUCOSE BLOOD VI STRP: ORAL_STRIP | Status: DC

## 2013-09-24 MED ORDER — GLYBURIDE 2.5 MG PO TABS: 2.5000 mg | ORAL_TABLET | Freq: Every day | ORAL | Status: DC

## 2013-09-24 NOTE — Patient Instructions (Signed)

## 2013-09-24 NOTE — Progress Notes (Signed)
FBS 65-79 2 hr pp 62-90 Meter checked but I cannot find BS past 3/7 Has finished her glyburide--Bs look great on no meds.--will refill--have D and N management check meter and get hgb A1C--there is evidence that she is making up her numbers--will re-check next visit. For NST--NST reviewed and reactive. For cerclage removal today U/S for growth tomorrow

## 2013-09-24 NOTE — Progress Notes (Signed)
Diabetes: Reviewed glucose log book and compared to glucometer. I feel that patient is not testing as directed and that the numbers in her book may not be legitimate. I reinforced with patient to need to test FBS & 2hpp daily and log. I noted that the numbers in the meter needed to match the numbers in the log. Pt verbalized understanding.

## 2013-09-24 NOTE — Addendum Note (Signed)
Addended by: Rosendo GrosHALPIN, Deatra Mcmahen L on: 09/24/2013 12:39 PM   Modules accepted: Orders

## 2013-09-24 NOTE — Progress Notes (Signed)
P=97  NST/OB visit, swelling in both feet/painful, right more swollen than left.  Pt has not been taking glyburide, states pharmacy has not called,. In system states patient has not picked up prescription.

## 2013-09-27 ENCOUNTER — Other Ambulatory Visit: Payer: Self-pay

## 2013-09-27 ENCOUNTER — Ambulatory Visit (HOSPITAL_COMMUNITY)
Admission: RE | Admit: 2013-09-27 | Discharge: 2013-09-27 | Disposition: A | Discharge status: Home / Self Care | Payer: Medicaid Other | Source: Ambulatory Visit | Attending: Family Medicine | Admitting: Family Medicine

## 2013-09-27 DIAGNOSIS — O24913 Unspecified diabetes mellitus in pregnancy, third trimester: Secondary | ICD-10-CM

## 2013-09-27 DIAGNOSIS — O99810 Abnormal glucose complicating pregnancy: Secondary | ICD-10-CM | POA: Insufficient documentation

## 2013-09-27 DIAGNOSIS — O3660X0 Maternal care for excessive fetal growth, unspecified trimester, not applicable or unspecified: Secondary | ICD-10-CM | POA: Insufficient documentation

## 2013-10-01 ENCOUNTER — Ambulatory Visit (INDEPENDENT_AMBULATORY_CARE_PROVIDER_SITE_OTHER): Payer: Self-pay | Admitting: Family Medicine

## 2013-10-01 VITALS — BP 114/65 | Wt 176.8 lb

## 2013-10-01 DIAGNOSIS — O24919 Unspecified diabetes mellitus in pregnancy, unspecified trimester: Secondary | ICD-10-CM

## 2013-10-01 DIAGNOSIS — O09219 Supervision of pregnancy with history of pre-term labor, unspecified trimester: Secondary | ICD-10-CM

## 2013-10-01 DIAGNOSIS — E119 Type 2 diabetes mellitus without complications: Secondary | ICD-10-CM

## 2013-10-01 DIAGNOSIS — O26879 Cervical shortening, unspecified trimester: Secondary | ICD-10-CM

## 2013-10-01 DIAGNOSIS — O24913 Unspecified diabetes mellitus in pregnancy, third trimester: Secondary | ICD-10-CM

## 2013-10-01 DIAGNOSIS — O09299 Supervision of pregnancy with other poor reproductive or obstetric history, unspecified trimester: Secondary | ICD-10-CM

## 2013-10-01 DIAGNOSIS — O3430 Maternal care for cervical incompetence, unspecified trimester: Secondary | ICD-10-CM

## 2013-10-01 LAB — POCT URINALYSIS DIP (DEVICE)
Bilirubin Urine: NEGATIVE
GLUCOSE, UA: NEGATIVE mg/dL
KETONES UR: NEGATIVE mg/dL
Nitrite: NEGATIVE
Protein, ur: NEGATIVE mg/dL
Specific Gravity, Urine: 1.02 (ref 1.005–1.030)
Urobilinogen, UA: 0.2 mg/dL (ref 0.0–1.0)
pH: 6 (ref 5.0–8.0)

## 2013-10-01 LAB — GLUCOSE, CAPILLARY: Glucose-Capillary: 168 mg/dL — ABNORMAL HIGH (ref 70–99)

## 2013-10-01 NOTE — Progress Notes (Signed)
NST reviewed and reactive. No BS today-last week appeared to be fabricating BS #'s--reports they are well--HgbA1C last week 5.7 U/S growth on 4/9 vtx, AFI: 15.39, 6 lb 14 oz (64%) Schedule IOL at 39 wks

## 2013-10-01 NOTE — Patient Instructions (Addendum)
Lactancia materna (Breastfeeding) Decidir amamantar es una de las mejores elecciones que puede hacer por usted y su beb. El cambio hormonal durante el embarazo produce el desarrollo del tejido mamario y aumenta la cantidad y el tamao de los conductos galactforos. Estas hormonas tambin permiten que las protenas, los azcares y las grasas de la sangre produzcan la leche materna en las glndulas productoras de leche. Las hormonas impiden que la leche materna sea liberada antes del nacimiento del beb, adems de impulsar el flujo de leche luego del nacimiento. Una vez que ha comenzado a amamantar, pensar en el beb, as como la succin o el llanto, pueden estimular la liberacin de leche de las glndulas productoras de leche.  LOS BENEFICIOS DE AMAMANTAR Para el beb  La primera leche (calostro) ayuda al mejor funcionamiento del sistema digestivo del beb.  La leche tiene anticuerpos que ayudan a prevenir las infecciones en el beb.  El beb tiene una menor incidencia de asma, alergias y del sndrome de muerte sbita del lactante.  Los nutrientes en la leche materna son mejores para el beb que la leche maternizada y estn preparados exclusivamente para cubrir las necesidades del beb.  La leche materna mejora el desarrollo cerebral del beb.  Es menos probable que el beb desarrolle otras enfermedades, como obesidad infantil, asma o diabetes mellitus de tipo 2. Para usted   La lactancia materna favorece el desarrollo de un vnculo muy especial entre la madre y el beb.  Es conveniente. Siempre est disponible a la temperatura correcta y es econmica.  La lactancia materna ayuda a quemar caloras y a perder el peso ganado durante el embarazo.  Favorece la contraccin del tero al tamao que tena antes del embarazo de manera ms rpida y disminuye el sangrado (loquios) despus del parto.  La lactancia materna contribuye a reducir el riesgo de desarrollar diabetes mellitus de tipo 2,  osteoporosis o cncer de mama o de ovario en el futuro. SIGNOS DE QUE EL BEB EST HAMBRIENTO Primeros signos de hambre  Aumenta su estado de alerta o actividad.  Se estira.  Mueve la cabeza de un lado a otro.  Mueve la cabeza y abre la boca cuando se le toca la mejilla o la comisura de la boca (reflejo de bsqueda).  Aumenta las vocalizaciones, tales como sonidos de succin, se relame los labios, emite arrullos, suspiros, o chirridos.  Mueve la mano hacia la boca.  Se chupa con ganas los dedos o las manos. Signos tardos de hambre  Est agitado.  Llora de manera intermitente. Signos de hambre extrema Los signos de hambre extrema requerirn que lo calme y lo consuele antes de que el beb pueda alimentarse adecuadamente. No espere a que se manifiesten los siguientes signos de hambre extrema para comenzar a amamantar:   Agitacin.  Llanto intenso y fuerte.   Gritos. INFORMACIN BSICA SOBRE LA LACTANCIA MATERNA Iniciacin de la lactancia materna  Encuentre un lugar cmodo para sentarse o acostarse, con un buen respaldo para el cuello y la espalda.  Coloque una almohada o una manta enrollada debajo del beb para acomodarlo a la altura de la mama (si est sentada). Las almohadas para amamantar se han diseado especialmente a fin de servir de apoyo para los brazos y el beb mientras amamanta.  Asegrese de que el abdomen del beb est frente al suyo.  Masajee suavemente la mama. Con las yemas de los dedos, masajee la pared del pecho hacia el pezn en un movimiento circular. Esto estimula el flujo   de leche. Es posible que deba continuar este movimiento mientras amamanta si la leche fluye lentamente.  Sostenga la mama con el pulgar por arriba del pezn y los otros 4 dedos por debajo de la mama. Asegrese de que los dedos se encuentren lejos del pezn y de la boca del beb.  Empuje suavemente los labios del beb con el pezn o con el dedo.  Cuando la boca del beb se abra lo  suficiente, acrquelo rpidamente a la mama e introduzca todo el pezn y la zona oscura que lo rodea (areola), tanto como sea posible, dentro de la boca del beb.  Debe haber ms areola visible por arriba del labio superior del beb que por debajo del labio inferior.  La lengua del beb debe estar entre la enca inferior y la mama.  Asegrese de que la boca del beb est en la posicin correcta alrededor del pezn (prendida). Los labios del beb deben crear un sello sobre la mama, doblndose hacia afuera (invertidos).  Es comn que el beb succione durante 2 a 3 minutos para que comience el flujo de leche materna. Cmo debe prenderse Es muy importante que le ensee al beb cmo prenderse adecuadamente a la mama. Si el beb no se prende adecuadamente, puede causarle dolor en el pezn y reducir la produccin de leche materna, y hacer que el beb tenga un escaso aumento de peso. Adems, si el beb no se prende adecuadamente al pezn, puede tragar aire durante la alimentacin. Esto puede causarle molestias al beb. Hacer eructar al beb al cambiar de mama puede ayudarlo a liberar el aire. Sin embargo, ensearle al beb cmo prenderse a la mama adecuadamente es la mejor manera de evitar que se sienta molesto por tragar aire mientras se alimenta. Signos de que el beb se ha prendido adecuadamente al pezn:   Tironea o succiona de modo silencioso, sin causarle dolor.  Se escucha que traga cada 3 o 4 succiones.   Hay movimientos musculares por arriba y por delante de sus odos al succionar. Signos de que el beb no se ha prendido adecuadamente al pezn:   Hace ruidos de succin o de chasquido mientras se alimenta.  Dolor en el pezn. Si cree que el beb no se prendi correctamente, deslice el dedo en la comisura de la boca y colquelo entre las encas del beb para interrumpir la succin. Intente comenzar a amamantar nuevamente. Signos de lactancia materna exitosa Signos del beb:   Disminucin  gradual en el nmero de succiones o cese completo de la succin.  Se duerme.  Relaja el cuerpo.  Retiene una pequea cantidad de leche en su boca.  Se desprende solo del pecho. Signos que presenta usted:  Las mamas han aumentado la firmeza, el peso y el tamao 1 a 3 horas despus de amamantar.  Estn ms blandas inmediatamente despus de amamantar.  Un aumento del volumen de leche, y tambin el cambio de su consistencia y color se producen hacia el quinto da de lactancia materna.  Los pezones no duelen, ni estn agrietados ni sangran. Signos de que su beb recibe la cantidad de leche suficiente  Moja al menos 3 paales en 24 horas. La orina debe ser clara y de color amarillo plido a los 5 das de vida.  Defeca al menos 3 veces en 24 horas a los 5 das de vida. La materia fecal debe ser blanda y amarillenta.  Defeca al menos 3 veces en 24 horas a los 7 das de vida. La   materia fecal debe ser grumosa y amarillenta.  No registra una prdida de peso mayor del 10% del peso al nacer durante los primeros 3 das de vida.  Aumenta de peso un promedio de 4 a 7onzas (120 a 210ml) por semana despus de los 4 das de vida.  Aumenta de peso, diariamente, de manera consistente a partir de los 5 das de vida, sin registrar prdida de peso despus de las 2 semanas de vida. Despus de alimentarse, es posible que el beb regurgite una pequea cantidad. Esto es frecuente. FRECUENCIA Y DURACIN DE LA LACTANCIA MATERNA El amamantamiento frecuente la ayudar a producir ms leche y a prevenir problemas de dolor en los pezones e hinchazn en las mamas. Alimente al beb cuando muestre signos de hambre o si siente la necesidad de reducir la congestin de las mamas. Esto se denomina "lactancia a demanda". Evite el uso del chupete mientras trabaja para establecer la lactancia (las primeras 4 a 6 semanas despus del nacimiento del beb). Despus de este perodo, podr ofrecerle un chupete. Las  investigaciones demostraron que el uso del chupete durante el primer ao de vida del beb disminuye el riesgo de desarrollar el sndrome de muerte sbita del lactante (SMSL). Permita que el nio se alimente en cada mama todo lo que desee. Contine amamantando al beb hasta que haya terminado de alimentarse. Cuando el beb se desprende o se queda dormido mientras se est alimentando de la primera mama, ofrzcale la segunda. Debido a que, con frecuencia, los recin nacidos permanecen somnolientos las primeras semanas de vida, es posible que deba despertar a su beb para alimentarlo. Los horarios de lactancia varan de un beb a otro. Sin embargo, las siguientes reglas pueden servir como gua para ayudarle a garantizar que el beb se alimenta adecuadamente:  Se puede amamantar a los recin nacidos (bebs de 4 semanas o menos de vida) cada 1 a 3 horas.  No deben transcurrir ms de 3 horas durante el da o 5 horas durante la noche sin que se amamante a los recin nacidos.  Debe amamantar al beb 8 veces como mnimo, en un perodo de 24 horas, hasta que comience a introducir slidos en su dieta, a los 6 meses de vida aproximadamente. EXTRACCIN DE LECHE MATERNA La extraccin y el almacenamiento de la leche materna le permiten asegurarse de que el beb se alimente exclusivamente de leche materna, aun en momentos en los que no puede amamantar. Esto tiene especial importancia si debe regresar al trabajo en el perodo en que an est amamantando o si no puede estar presente en los momentos en que el beb debe alimentarse. Su asesor en lactancia puede orientarla sobre cunto tiempo es seguro almacenar leche materna.  El sacaleche es un aparato que le permite extraer leche de la mama a un recipiente estril. Luego, la leche materna extrada puede almacenarse en un refrigerador o freezer. Algunos sacaleches son manuales, mientras que otros son elctricos. Consulte a su asesor en lactancia qu tipo ser ms conveniente  para usted. Los sacaleches se pueden comprar, sin embargo, algunos hospitales y grupos de apoyo a la lactancia materna alquilan sacaleches mensualmente. Un asesor en lactancia puede ensearle cmo extraer leche materna manualmente, en caso de que prefiera no usar un sacaleche.  CMO CUIDAR LAS MAMAS DURANTE LA LACTANCIA MATERNA Los pezones se secan, agrietan y duelen durante la lactancia materna. Las siguientes recomendaciones pueden ayudarle a mantener las mamas humectadas y sanas:  Evite usar jabn en los pezones.  Use un sostn   de soporte. Aunque no son esenciales, las camisetas sin mangas o los sostenes especiales para amamantar estn diseados para acceder fcilmente a las mamas, para amamantar sin tener que quitarse todo el sostn o la camiseta. Evite usar sostenes con aro o sostenes muy ajustados.  Seque al aire sus pezones durante 3 a 4minutos despus de amamantar al beb.  Utilice solo apsitos de algodn en el sostn para absorber las prdidas de leche. La prdida de un poco de leche materna entre las tomas es normal.  Utilice lanolina sobre los pezones luego de amamantar. La lanolina ayuda a mantener la humedad normal de la piel. Si usa lanolina pura, no tiene que lavarse los pezones antes de alimentar al beb. La lanolina pura no es txica para el beb. Adems, puede extraer manualmente algunas gotas de leche materna y masajear suavemente esa leche sobre los pezones, para que la leche se seque al aire. Durante las primeras semanas despus de dar a luz, algunas mujeres pueden experimentar hinchazn en las mamas (congestin mamaria). La congestin puede hacer que sienta las mamas pesadas, calientes y sensibles al tacto. El pico de la congestin ocurre dentro de los 3 a 5 das despus del parto. Las siguientes recomendaciones pueden ayudarle a aliviar la congestin:  Vace por completo las mamas al amamantar o extraer leche. Puede aplicar calor hmedo en las mamas (en la ducha o con toallas  hmedas para manos) antes de amamantar o extraer leche. Esto aumenta la circulacin y ayuda a que la leche fluya. Si el beb no vaca por completo las mamas cuando lo amamanta, extraiga la leche restante despus de que haya finalizado.  Use un sostn ajustado (para amamantar o comn) o camiseta sin mangas durante 1 o 2 das para indicar al cuerpo que disminuya ligeramente la produccin de leche.  Aplique compresas de hielo sobre las mamas, a menos que le resulte demasiado incmodo.  Asegrese de que el beb se encuentre en la posicin correcta mientras lo alimenta. Si la congestin persiste luego de 48 horas o despus de seguir estas recomendaciones, comunquese con su mdico o un asesor en lactancia. RECOMENDACIONES GENERALES PARA EL CUIDADO DE LA SALUD DURANTE LA LACTANCIA MATERNA  Consuma alimentos saludables. Alterne comidas y colaciones, comiendo 3 de cada una por da. Dado que lo que come afecta la leche materna, es posible que algunas comidas hagan que su beb se vuelva ms irritable de lo habitual. Evite comer este tipo de alimentos, si percibe que afectan de manera negativa al beb.  Beba leche, jugos de fruta y agua para satisfacer su sed (aproximadamente 10 vasos al da).  Descanse con frecuencia, reljese y tome sus vitaminas prenatales para evitar la fatiga, el estrs y la anemia.  Contine con los autocontroles de la mama.  Evite masticar y fumar tabaco.  Evite el consumo de alcohol y drogas. Algunos medicamentos, que pueden ser perjudiciales para el beb, pueden pasar a travs de la leche materna. Es importante que consulte a su mdico antes de tomar cualquier medicamento, incluidos todos los medicamentos recetados y de venta libre, as como los suplementos vitamnicos y herbales. Puede quedar embarazada durante la lactancia. Si desea controlar la natalidad, consulte a su mdico cules son las opciones ms seguras para el beb. SOLICITE ATENCIN MDICA SI:   Usted siente que  quiere dejar de amamantar o se siente frustrada con la lactancia.  Siente dolor en las mamas o en los pezones.  Sus pezones estn agrietados o sangran.  Sus pechos estn irritados,   sensibles o calientes.  Tiene un rea hinchada en cualquiera de las mamas.  Siente escalofros o fiebre.  Tiene nuseas o vmitos.  Presenta una secrecin de otro lquido distinto de la leche materna de los pezones.  Sus mamas no se llenan antes de Museum/gallery exhibitions officeramamantar al beb para el 5. da despus del parto.  Se siente triste y deprimida.  El beb est demasiado somnoliento como para comer bien.  El beb tiene problemas para dormir.  Moja menos de 3 paales en 24 horas.  Defeca menos de 3 veces en 24 horas.  La piel del beb o la parte blanca de sus ojos est amarilla.  El beb no ha aumentado de Clementonpeso a los [address] das de [place]vida. SOLICITE ATENCIN MDICA DE INMEDIATO SI:   El beb est muy cansado (aletargado) y no se despierta para comer.  Le sube la fiebre sin causa. Document Released: 06/07/2005 Document Revised: 10/02/2012 [facility] LPExitCare Patient Information 2014 [place]ExitCare, MarylandLLC. Induccin del trabajo de parto  (Labor Induction ) Se denomina induccin del trabajo de parto cuando se inician acciones para hacer que una mujer embarazada comience el Raglandtrabajo de Morgantownparto. La Harley-Davidsonmayora de las mujeres comienzan el trabajo de parto sin ayuda entre las semanas 37 y 42 del Psychiatristembarazo. Cuando esto no ocurre o cuando hay una necesidad mdica, pueden utilizarse diferentes mtodos para inducirlo. La induccin del trabajo de parto hace que el tero se contraiga. Tambin hace que el cuello del tero se ablandemadure), se abra (se dilate), y se afine (se borre). Generalmente el trabajo de parto no se induce antes de las 39 semanas excepto que haya un problema con el beb o con la East Enterprisemadre.  Antes de inducir el trabajo de parto, el mdico considerar cierto nmero de factores incluyendo los siguientes:  El estado del beb.  Cuntas semanas  tiene de McCord Bendembarazo.  La madurez de los pulmones del beb.  El Blandvilleestado del cuello del tero.  La posicin del beb. CULES SON LOS MOTIVOS PARA INDUCIR UN PARTO? El Isle of Palmstrabajo de parto puede inducirse por las siguientes razones:  La salud del beb o de la madre estn en riesgo.  El embarazo se ha pasado de trmino en 1 semana o ms.  Ha roto la bolsa de aguas pero no se ha iniciado el trabajo de parto por s mismo.  La madre tiene algn trastorno de salud o una enfermedad grave, como hipertensin arterial, una infeccin, desprendimiento abrupto de la placenta o diabetes.  Hay escaso lquido amnitico alrededor del beb.  El beb presenta sufrimiento. La conveniencia o el deseo de que el beb nazca en una cierta fecha no es un motivo para inducir el Pin[place] Cornerparto. CULES SON LOS MTODOS UTILIZADOS PARA INDUCIR EL TRABAJO DE PARTO? Algunos mtodos de induccin del Ihor Dowtrabajo de parto son:   Administracin del medicamentos prostaglandina. Este medicamento hace que el cuello uterino se dilate y Semmesmadure. Este medicamento tambin iniciar las contracciones. Puede tomarse por boca o insertarse en la vagina en forma de supositorio.  Insercin en la vagina de un tubo delgado (catter) con un baln en el extremo para dilatar el cuello del tero. Una vez insertado, el baln se infla con agua, lo que provoca la apertura del cuello del tero.  Ruptura de las Parismembranas. El mdico separa el saco amnitico del cuello uterino, haciendo que el cuello uterino se distienda y cause la liberacin de la hormona llamada progesterona. Esto hace que el tero se contraiga. Este procedimiento se realiza durante una visita al Starbucks Corporationconsultorio mdico.  Le indicarn que vuelva a su casa y espere que se inicien las contracciones. Luego tendr que volver para la induccin.  Ruptura de la bolsa de aguas. El mdico romper el saco amnitico con un pequeo instrumento. Una vez que el saco amnitico se rompe, las Therapist, occupationalcontracciones deben comenzar.  Pueden pasar algunas horas hasta que Toll Brothershaga efecto.  Medicamentos que desencadenen o intensifiquen las contracciones. Se lo administrarn a travs de un catter por va intravenosa (IV) que se inserta en una de las venas del brazo. Todos los mtodos de induccin, excepto la ruptura de Livingstonmembranas, se Futures traderrealizan en el hospital. La induccin se Games developerrealizar en el hospital, de modo que usted y el beb puedan ser controlados cuidadosamente.  CUNTO TIEMPO LLEVA INDUCIR EL TRABAJO DE PARTO? Algunas inducciones pueden demorar entre 2 y [address] das. Generalmente lleva Sara Leemenos tiempo, dependiendo del Gypsumestado del cuello del tero. Puede tomar ms tiempo si la induccin se realiza en etapas tempranas del Psychiatristembarazo o es su Tree surgeonprimer embarazo. Si han pasado 2 o [address] das y no se inicia el trabajo de [place] Cityparto, podrn enviarla a su casa o Magazine features editorrealizar una cesrea. CULES SON LOS RIESGOS ASOCIADOS CON LA INDUCCiN DEL TRABAJO DE PARTO? Algunos de los riesgos de la induccin son:   Cambios en la frecuencia cardaca fetal, por ejemplo los latidos son demasiado rpidos, o lentos, o errticos.  Riesgo de distrs fetal.  Posibilidad de infeccin en la madre o el beb.  Aumento de la posibilidad de que sea necesaria una cesrea.  Ruptura (abrupcin) de la placenta del tero (raro).  Ruptura uterina (muy raro). Cuando es Passenger transport managernecesario realizar la induccin por razones mdicas, los beneficios deben superar a los Marysvilleriesgos. CULES SON ALGUNAS RAZONES PARA NO INDUCIR EL TRABAJO DE PARTO? La induccin no debe realizarse si:   Se demuestra que el beb no tolera el trabajo de [place]parto.  Fue sometida anteriormente a Personnel officercirugas en el tero, como una miomectoma o le han extirpado fibromas.  La placenta est en una posicin muy baja en el tero y obstruye la abertura del cuello (placenta previa).  El beb no est ubicado con la Walgreencabeza hacia bajo.  El cordn umbilical cae hacia el canal de parto, adelante del beb. Esto puede cortar el suministro de  Elsiesangre y oxgeno al beb.  Fue sometida a Higher education careers adviseruna cesrea anteriormente.  Hay circunstancias poco habituales, como que el beb es Doctor, general practiceextremadamente prematuro. Document Released: 09/14/2007 Document Revised: 02/07/2013 [facility] CenterExitCare Patient Information 2014 [place]oundExitCare, MarylandLLC.

## 2013-10-01 NOTE — Progress Notes (Signed)
P = 101    Pt reports strong UC's last night.  US for growth done 4/9.  IOL scheduled 4/20 @ 1930.  2hr pp CBG taken = 168.  Pt reports eating only bread and grits @ breakfast.  Pt advised that she needs protein with every meal and snack.

## 2013-10-02 ENCOUNTER — Inpatient Hospital Stay (HOSPITAL_COMMUNITY)
Admission: AD | Admit: 2013-10-02 | Discharge: 2013-10-04 | DRG: 775 | Disposition: A | Discharge status: Home / Self Care | Payer: Medicaid Other | Source: Ambulatory Visit | Attending: Obstetrics & Gynecology | Admitting: Obstetrics & Gynecology

## 2013-10-02 ENCOUNTER — Other Ambulatory Visit: Payer: Self-pay

## 2013-10-02 ENCOUNTER — Encounter (HOSPITAL_COMMUNITY): Payer: Self-pay | Admitting: *Deleted

## 2013-10-02 ENCOUNTER — Telehealth (HOSPITAL_COMMUNITY): Payer: Self-pay | Admitting: *Deleted

## 2013-10-02 DIAGNOSIS — O99814 Abnormal glucose complicating childbirth: Principal | ICD-10-CM | POA: Diagnosis present

## 2013-10-02 DIAGNOSIS — O09219 Supervision of pregnancy with history of pre-term labor, unspecified trimester: Secondary | ICD-10-CM

## 2013-10-02 DIAGNOSIS — O24913 Unspecified diabetes mellitus in pregnancy, third trimester: Secondary | ICD-10-CM

## 2013-10-02 DIAGNOSIS — O09299 Supervision of pregnancy with other poor reproductive or obstetric history, unspecified trimester: Secondary | ICD-10-CM

## 2013-10-02 DIAGNOSIS — O26879 Cervical shortening, unspecified trimester: Secondary | ICD-10-CM | POA: Diagnosis present

## 2013-10-02 DIAGNOSIS — IMO0001 Reserved for inherently not codable concepts without codable children: Secondary | ICD-10-CM

## 2013-10-02 DIAGNOSIS — O09899 Supervision of other high risk pregnancies, unspecified trimester: Secondary | ICD-10-CM

## 2013-10-02 LAB — CBC
HEMATOCRIT: 32.5 % — AB (ref 36.0–46.0)
Hemoglobin: 10.4 g/dL — ABNORMAL LOW (ref 12.0–15.0)
MCH: 27.2 pg (ref 26.0–34.0)
MCHC: 32 g/dL (ref 30.0–36.0)
MCV: 85.1 fL (ref 78.0–100.0)
Platelets: 311 10*3/uL (ref 150–400)
RBC: 3.82 MIL/uL — ABNORMAL LOW (ref 3.87–5.11)
RDW: 14.8 % (ref 11.5–15.5)
WBC: 8.1 10*3/uL (ref 4.0–10.5)

## 2013-10-02 LAB — TYPE AND SCREEN
ABO/RH(D): A POS
Antibody Screen: NEGATIVE

## 2013-10-02 LAB — GLUCOSE, CAPILLARY
GLUCOSE-CAPILLARY: 105 mg/dL — AB (ref 70–99)
Glucose-Capillary: 82 mg/dL (ref 70–99)

## 2013-10-02 MED ORDER — FENTANYL CITRATE 0.05 MG/ML IJ SOLN
100.0000 ug | INTRAMUSCULAR | Status: DC | PRN
  Administered 2013-10-02 – 2013-10-03 (×4): 100 ug via INTRAVENOUS
  Filled 2013-10-02 (×4): qty 2

## 2013-10-02 MED ORDER — OXYTOCIN 40 UNITS IN LACTATED RINGERS INFUSION - SIMPLE MED
1.0000 m[IU]/min | INTRAVENOUS | Status: DC
  Administered 2013-10-02: 2 m[IU]/min via INTRAVENOUS

## 2013-10-02 MED ORDER — LACTATED RINGERS IV SOLN: 500.0000 mL | INTRAVENOUS | Status: DC | PRN

## 2013-10-02 MED ORDER — ONDANSETRON HCL 4 MG/2ML IJ SOLN: 4.0000 mg | Freq: Four times a day (QID) | INTRAMUSCULAR | Status: DC | PRN

## 2013-10-02 MED ORDER — OXYTOCIN 40 UNITS IN LACTATED RINGERS INFUSION - SIMPLE MED
62.5000 mL/h | INTRAVENOUS | Status: DC
  Filled 2013-10-02: qty 1000

## 2013-10-02 MED ORDER — LIDOCAINE HCL (PF) 1 % IJ SOLN
30.0000 mL | INTRAMUSCULAR | Status: DC | PRN
  Filled 2013-10-02: qty 30

## 2013-10-02 MED ORDER — ACCU-CHEK SOFTCLIX LANCETS MISC: 1.0000 | Freq: Four times a day (QID) | Status: DC

## 2013-10-02 MED ORDER — IBUPROFEN 600 MG PO TABS
600.0000 mg | ORAL_TABLET | Freq: Four times a day (QID) | ORAL | Status: DC | PRN
  Administered 2013-10-03: 600 mg via ORAL
  Filled 2013-10-02: qty 1

## 2013-10-02 MED ORDER — LACTATED RINGERS IV SOLN
INTRAVENOUS | Status: DC
  Administered 2013-10-02 – 2013-10-03 (×2): via INTRAVENOUS

## 2013-10-02 MED ORDER — ACETAMINOPHEN 325 MG PO TABS: 650.0000 mg | ORAL_TABLET | ORAL | Status: DC | PRN

## 2013-10-02 MED ORDER — OXYCODONE-ACETAMINOPHEN 5-325 MG PO TABS
1.0000 | ORAL_TABLET | ORAL | Status: DC | PRN
  Administered 2013-10-03 (×2): 1 via ORAL
  Filled 2013-10-02 (×2): qty 1

## 2013-10-02 MED ORDER — CITRIC ACID-SODIUM CITRATE 334-500 MG/5ML PO SOLN: 30.0000 mL | ORAL | Status: DC | PRN

## 2013-10-02 MED ORDER — TERBUTALINE SULFATE 1 MG/ML IJ SOLN: 0.2500 mg | Freq: Once | INTRAMUSCULAR | Status: AC | PRN

## 2013-10-02 MED ORDER — OXYTOCIN BOLUS FROM INFUSION
500.0000 mL | INTRAVENOUS | Status: DC
  Administered 2013-10-03: 500 mL via INTRAVENOUS

## 2013-10-02 NOTE — Telephone Encounter (Signed)
Pharmacy contacted us via fax in changing the fastclix lancets to softclix lancets.

## 2013-10-02 NOTE — MAU Note (Signed)
Patient states she is having contractions every 7 minutes. Denies bleeding or leaking. Reports fetal movement but not as much as usual.

## 2013-10-02 NOTE — H&P (Signed)
Veronica Mccormick is a 33 y.o. female [phone] at 1424w1d presents with SOL.  Patient reports that she began having contractions earlier this morning around 5-530am.  Contractions became more frequent and pain this afternoon at 4PM prompting her to come in for evaluation.  She denies vaginal bleeding, LOF.  She reports + fetal movement, but reports that it is less than normal.   No other complaints currently.  PNC: [facility]. 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  Patient had cerclage which was recently removed on 4/6.  History OB History   Grav Para Term Preterm Abortions TAB SAB Ect Mult Living   8 4 3 1 3  0 2 1 0 3     Past Medical History  Diagnosis Date  . Medical history non-contributory   . Depression     Postpartum after fetal demise  . Vaginal delivery 1996, [phone]  . History of preterm delivery, currently pregnant 04/26/2013    17p > funneling to cerclage on 12/19 - repeat US in 2 weeks    Past Surgical History  Procedure Laterality Date  . Breast surgery    . Cervical cerclage N/A 05/22/2013    Procedure: CERCLAGE CERVICAL;  Surgeon: Adam PhenixJames G Arnold, MD;  Location: [facility];  Service: Gynecology;  Laterality: N/A;   Family History: family history is negative for Alcohol abuse, Arthritis, Asthma, Birth defects, Cancer, COPD, Depression, Diabetes, Drug abuse, Early death, Hearing loss, Heart disease, Hypertension, Hyperlipidemia, Kidney disease, Learning disabilities, Mental illness, Mental retardation, Miscarriages / Stillbirths, Stroke, and Vision loss. Social History:  reports that she has never smoked. She has never used smokeless tobacco. She reports that she does not drink alcohol or use illicit drugs.   Prenatal Transfer Tool  Maternal Diabetes: Yes; A2GDM Genetic Screening: Not done Maternal Ultrasounds/Referrals: Normal Fetal Ultrasounds or other Referrals:  None Maternal  Substance Abuse:  No Significant Maternal Medications:  Meds include: Other: Glyburide Significant Maternal Lab Results:  Lab values include: Group B Strep negative  ROS Per HPI   Blood pressure 133/58, pulse 92, temperature 97.5 F (36.4 C), temperature source Oral, resp. rate 20, height 5\' 1"  (1.549 m), weight 79.652 kg (175 lb 9.6 oz), last menstrual period 01/15/2013, SpO2 98.00%. Exam Physical Exam  Gen: well appearing female in NAD. Uncomfortable with contractions. Heart: RRR Lungs: CTAB. Abd: gravid but otherwise soft, nontender to palpation Ext: trace lower extremity edema bilaterally Neuro: No focal deficits. Cervical Exam:  Dilation: 4 Effacement (%): 100 Cervical Position: [place]: 0 Presentation: Vertex Exam by:: R. gagnon,rnc  FHR: baseline 135, mod variability, 15x15 accels, no decels Toco: Q2-445min  Prenatal labs: ABO, Rh: --/--/A POS (03/03 0545) Antibody: NEG (03/03 0545) Rubella: 11.20 (10/09 1145) RPR: NON REAC (02/12 1520)  HBsAg: NEGATIVE (10/09 1145)  HIV: NON REACTIVE (02/12 1520)  GBS: Negative (03/30 0000)   Assessment/Plan: Veronica Mccormick is a 33 y.o. [id number] at 7524w1d who presents with SOL.  #Labor: Expectant management # A2GDM: CBG's Q4H #Pain: IV Fentanyl PRN; Declined epidural #FWB: Cat 1 #ID: [id number] Neg #MOF: Breast/Bottle #MOC: IUD   Tommie SamsJayce G Cook 10/02/2013, 6:18 PM  I examined pt and agree with documentation above and resident plan of care. Eino FarberWalidah Paul HalfN Muhammad, CNM

## 2013-10-02 NOTE — MAU Note (Signed)
Report given to Hardin Negusainey Gagnon, CCC/BS. Patient will be triaged in room 175.

## 2013-10-02 NOTE — Telephone Encounter (Signed)
Preadmission screen Interpreter number [phone]

## 2013-10-03 ENCOUNTER — Encounter (HOSPITAL_COMMUNITY): Payer: Self-pay | Admitting: *Deleted

## 2013-10-03 DIAGNOSIS — O26879 Cervical shortening, unspecified trimester: Secondary | ICD-10-CM

## 2013-10-03 DIAGNOSIS — O99814 Abnormal glucose complicating childbirth: Secondary | ICD-10-CM

## 2013-10-03 LAB — GLUCOSE, CAPILLARY: Glucose-Capillary: 81 mg/dL (ref 70–99)

## 2013-10-03 LAB — RPR

## 2013-10-03 MED ORDER — OXYCODONE-ACETAMINOPHEN 5-325 MG PO TABS
1.0000 | ORAL_TABLET | ORAL | Status: DC | PRN
  Administered 2013-10-03 – 2013-10-04 (×5): 2 via ORAL
  Filled 2013-10-03 (×5): qty 2

## 2013-10-03 MED ORDER — ONDANSETRON HCL 4 MG PO TABS: 4.0000 mg | ORAL_TABLET | ORAL | Status: DC | PRN

## 2013-10-03 MED ORDER — PRENATAL MULTIVITAMIN CH
1.0000 | ORAL_TABLET | Freq: Every day | ORAL | Status: DC
  Administered 2013-10-03 – 2013-10-04 (×2): 1 via ORAL
  Filled 2013-10-03 (×2): qty 1

## 2013-10-03 MED ORDER — ONDANSETRON HCL 4 MG/2ML IJ SOLN: 4.0000 mg | INTRAMUSCULAR | Status: DC | PRN

## 2013-10-03 MED ORDER — ZOLPIDEM TARTRATE 5 MG PO TABS
5.0000 mg | ORAL_TABLET | Freq: Every evening | ORAL | Status: DC | PRN
Start: 2013-10-03 — End: 2013-10-04

## 2013-10-03 MED ORDER — LANOLIN HYDROUS EX OINT: TOPICAL_OINTMENT | CUTANEOUS | Status: DC | PRN

## 2013-10-03 MED ORDER — DIBUCAINE 1 % RE OINT: 1.0000 "application " | TOPICAL_OINTMENT | RECTAL | Status: DC | PRN

## 2013-10-03 MED ORDER — WITCH HAZEL-GLYCERIN EX PADS: 1.0000 "application " | MEDICATED_PAD | CUTANEOUS | Status: DC | PRN

## 2013-10-03 MED ORDER — DIPHENHYDRAMINE HCL 25 MG PO CAPS: 25.0000 mg | ORAL_CAPSULE | Freq: Four times a day (QID) | ORAL | Status: DC | PRN

## 2013-10-03 MED ORDER — SIMETHICONE 80 MG PO CHEW
80.0000 mg | CHEWABLE_TABLET | ORAL | Status: DC | PRN
  Administered 2013-10-03: 80 mg via ORAL
  Filled 2013-10-03: qty 1

## 2013-10-03 MED ORDER — TETANUS-DIPHTH-ACELL PERTUSSIS 5-2.5-18.5 LF-MCG/0.5 IM SUSP: 0.5000 mL | Freq: Once | INTRAMUSCULAR | Status: DC

## 2013-10-03 MED ORDER — IBUPROFEN 600 MG PO TABS
600.0000 mg | ORAL_TABLET | Freq: Four times a day (QID) | ORAL | Status: DC
  Administered 2013-10-03 – 2013-10-04 (×5): 600 mg via ORAL
  Filled 2013-10-03 (×5): qty 1

## 2013-10-03 MED ORDER — SENNOSIDES-DOCUSATE SODIUM 8.6-50 MG PO TABS
2.0000 | ORAL_TABLET | ORAL | Status: DC
  Administered 2013-10-03: 2 via ORAL
  Filled 2013-10-03: qty 2

## 2013-10-03 MED ORDER — BENZOCAINE-MENTHOL 20-0.5 % EX AERO: 1.0000 "application " | INHALATION_SPRAY | CUTANEOUS | Status: DC | PRN

## 2013-10-03 NOTE — Progress Notes (Signed)
Patient was referred for history of depression/anxiety.  * Referral screened out by Clinical Social Worker because none of the following criteria appear to apply:  ~ History of anxiety/depression during this pregnancy, or of post-partum depression.  ~ Diagnosis of anxiety and/or depression within last 3 years  ~ History of depression due to pregnancy loss/loss of child  OR  * Patient's symptoms currently being treated with medication and/or therapy.  Please contact the Clinical Social Worker if needs arise, or by the patient's request.  Pt experienced situational depression after the loss of a child in 2010. She denies any depressed moods since then. FOB at the bedside & appears supportive.

## 2013-10-03 NOTE — Progress Notes (Signed)
Veronica Mccormick is a 33 y.o. [phone] at 4442w2d   Subjective: Coping with ctx; using Fentanyl  Objective: BP 118/79  Pulse 83  Temp(Src) 97.6 F (36.4 C) (Oral)  Resp 20  Ht 5\' 1"  (1.549 m)  Wt 79.652 kg (175 lb 9.6 oz)  BMI 33.20 kg/m2  SpO2 98%  LMP 01/15/2013      FHT:  FHR: 125-135 bpm, variability: moderate,  accelerations:  Present,  decelerations:  Absent UC:   regular, every 2-3 minutes with Pitocin @ 456mu/min SVE:   Dilation: 4 Effacement (%): 100 Station: 0 Exam by:: Clelia CroftShaw CNM- AROM for clear fluid  Labs: Lab Results  Component Value Date   WBC 8.1 10/02/2013   HGB 10.4* 10/02/2013   HCT 32.5* 10/02/2013   MCV 85.1 10/02/2013   PLT 311 10/02/2013    Assessment / Plan: Latent phase A2 GDM  Hope to increase labor with AROM Anticipate SVD  Arabella MerlesKimberly D Charmagne Buhl 10/03/2013, 12:27 AM

## 2013-10-03 NOTE — Lactation Note (Signed)
This note was copied from the chart of Veronica Mccormick. Lactation Consultation Note  Patient Name: Veronica Mccormick ZOXWR'UToday's Date: 10/03/2013 Reason for consult: Initial assessment Mom c/o of breast soreness, positional stripe noted on left nipple, left nipple shaft is short.  Hand expression demonstrated and advised to apply to sore nipples. Care for sore nipples reviewed, comfort gels given with instructions. Hand pump given to Mom and encouraged to pre-pump to help with latch.  Mom is 1st time BF. Basic teaching reviewed. Mom plans to breast/bottle feed. Encouraged to keep baby at the breast for the 1st few weeks, then offer supplement if desired. Discussed risk of early supplementation to BF success. Cluster feeding discussed. Mom has history of breast surgery, had lump removed from right breast, outer quadrant. Encouraged to BF 8-12 times in 24 hours and with feeding ques. Lactation brochure left for review, advised of OP services and support group. Encouraged to call for assist as needed. Mom declined interpreter at this visit.   Maternal Data Formula Feeding for Exclusion: Yes Reason for exclusion: Mother's choice to formula and breast feed on admission Infant to breast within first hour of birth: Yes Has patient been taught Hand Expression?: Yes Does the patient have breastfeeding experience prior to this delivery?: No  Feeding Feeding Type: Breast Fed  LATCH Score/Interventions Latch: Grasps breast easily, tongue down, lips flanged, rhythmical sucking.  Audible Swallowing: A few with stimulation Intervention(s): Hand expression  Type of Nipple: Flat Intervention(s): Reverse pressure  Comfort (Breast/Nipple): Filling, red/small blisters or bruises, mild/mod discomfort  Problem noted: Mild/Moderate discomfort (positional stripe Left nipple) Interventions (Mild/moderate discomfort): Comfort gels;Pre-pump if needed;Hand expression;Hand massage  Hold  (Positioning): Assistance needed to correctly position infant at breast and maintain latch.  LATCH Score: 6  Lactation Tools Discussed/Used Tools: Pump;Comfort gels Breast pump type: Manual WIC Program: Yes   Consult Status Consult Status: Follow-up Date: 10/04/13 Follow-up type: In-patient    Alfred LevinsKathy Ann Almond Fitzgibbon 10/03/2013, 4:56 PM

## 2013-10-03 NOTE — Progress Notes (Signed)
Ur chart review completed.  

## 2013-10-04 ENCOUNTER — Other Ambulatory Visit: Payer: Self-pay

## 2013-10-04 MED ORDER — IBUPROFEN 600 MG PO TABS: 600.0000 mg | ORAL_TABLET | Freq: Four times a day (QID) | ORAL | Status: DC

## 2013-10-04 MED ORDER — OXYCODONE-ACETAMINOPHEN 5-325 MG PO TABS: 1.0000 | ORAL_TABLET | ORAL | Status: DC | PRN

## 2013-10-04 MED ORDER — SIMETHICONE 80 MG PO CHEW: 80.0000 mg | CHEWABLE_TABLET | ORAL | Status: DC | PRN

## 2013-10-04 NOTE — Discharge Summary (Signed)
Attestation of Attending Supervision of Obstetric Fellow: Evaluation and management procedures were performed by the Obstetric Fellow under my supervision and collaboration.  I have reviewed the Obstetric Fellow's note and chart, and I agree with the management and plan.  UGONNA  ANYANWU, MD, FACOG Attending Obstetrician & Gynecologist Faculty Practice, [facility]   

## 2013-10-04 NOTE — Discharge Instructions (Signed)

## 2013-10-04 NOTE — Discharge Summary (Signed)
Obstetric Discharge Summary Reason for Admission: onset of labor Prenatal Procedures: cerclage removed 09/24/13 (d/t short cervix and prior PTL) and NST Intrapartum Procedures: spontaneous vaginal delivery Postpartum Procedures: none Complications-Operative and Postpartum: none Hemoglobin  Date Value Ref Range Status  10/02/2013 10.4* 12.0 - 15.0 g/dL Final     HCT  Date Value Ref Range Status  10/02/2013 32.5* 36.0 - 46.0 % Final    Discharge Diagnoses: Term Pregnancy-delivered  Hospital Course:  Veronica Mccormick is a 33 y.o. [id number], with pregnancy complicated by GDM (A2), hx PPROM and delivery at 23w, on 17P, shortened cervix requiring cerclage. She presented with SOL at term. She had a uncomplicated SVD within 24 hours of admission. She was able to ambulate, tolerate PO and void normally. Breastfeeding with lactation consultant support, plan to supplement with bottle. CSW evaluated given hx of depression due to prior fetal loss, patient stable without barriers to discharge. She was discharged home with instructions for postpartum care.    Delivery Note  At 2:31 AM a viable female was delivered via Vaginal, Spontaneous Delivery (Presentation: ; Occiput Anterior). APGAR: 8, 9; weight: pending.  Placenta status: Intact, Spontaneous. Cord: 3 vessels with the following complications: None.  Anesthesia: None  Episiotomy: None  Lacerations: None  Suture Repair: none  Est. Blood Loss (mL): 250  Mom to postpartum. Baby to Couplet care / Skin to Skin.  Upon arrival patient was completely dilated and pushing with good effort. During delivery, noted to have a shoulder cord that was reduced on delivery. Baby delivered without difficulty, was noted to have good tone and place on maternal abdomen for oral suctioning, drying and stimulation. Delayed cord clamping performed and cut by FOB. Placenta delivered intact with 3V cord. Vaginal canal and perineum was inspected and intact without lacerations.  Pitocin was started and uterus massaged until bleeding slowed. Counts of sharps, instruments, and lap pads were all correct.  Saralyn PilarAlexander Karamalegos  10/03/2013, 3:01 AM   I have seen and examined this patient and I agree with the above.  [name]  8:00 AM  10/03/2013   Physical Exam:  General: alert and cooperative Lochia: appropriate Uterine Fundus: firm DVT Evaluation: No evidence of DVT seen on physical exam. Negative Homan's sign. No cords or calf tenderness. No significant calf/ankle edema.  Discharge Information: Date: 10/04/2013 Activity: pelvic rest Diet: routine Medications: PNV, Ibuprofen, Percocet and Simethicone Baby feeding: plans to breastfeed Contraception: Depo-Provera Condition: stable Instructions: refer to practice specific booklet Discharge to: home   Newborn Data: Live born female  Birth Weight: 7 lb 13.2 oz (3550 g) APGAR: 8, 9  Home with mother.  Saralyn PilarAlexander Karamalegos, DO [facility], PGY-1  10/04/2013, 6:41 AM  I have seen and examined this patient and agree with above documentation in the resident's note.   Rulon AbideKeli Lylliana Kitamura, M.D. [facility] Fellow 10/04/2013 8:33 AM

## 2013-10-08 ENCOUNTER — Inpatient Hospital Stay (HOSPITAL_COMMUNITY): Admission: RE | Admit: 2013-10-08 | Payer: MEDICAID | Source: Ambulatory Visit

## 2013-11-19 ENCOUNTER — Ambulatory Visit: Payer: Self-pay | Admitting: Family Medicine

## 2014-02-15 ENCOUNTER — Encounter: Payer: Self-pay | Admitting: General Practice

## 2014-04-22 ENCOUNTER — Encounter (HOSPITAL_COMMUNITY): Payer: Self-pay | Admitting: *Deleted

## 2014-05-23 ENCOUNTER — Encounter: Payer: Self-pay | Admitting: Obstetrics & Gynecology

## 2014-07-31 IMAGING — US US MFM OB TRANSVAGINAL
1 series · 13 of 16 positions shown · non-contrast
Comparison: none

[Series 1: us mfm ob transvaginal · 0.28mm/px · 13 of 16 slices shown]
[im 1/16]
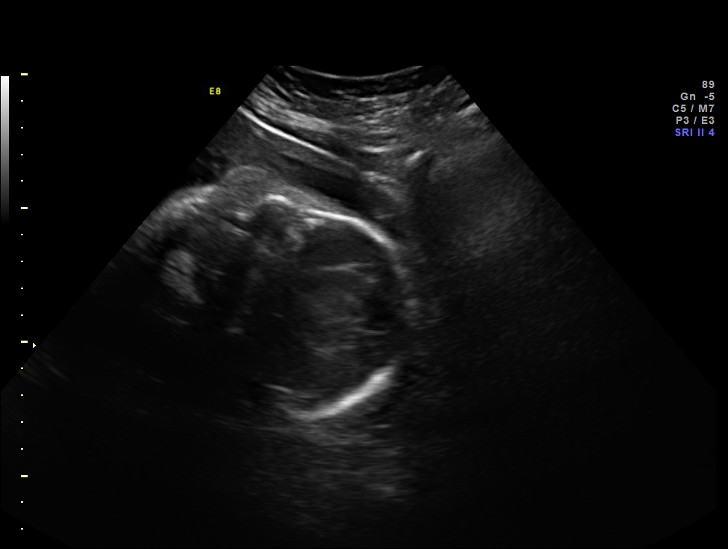
[im 2/16]
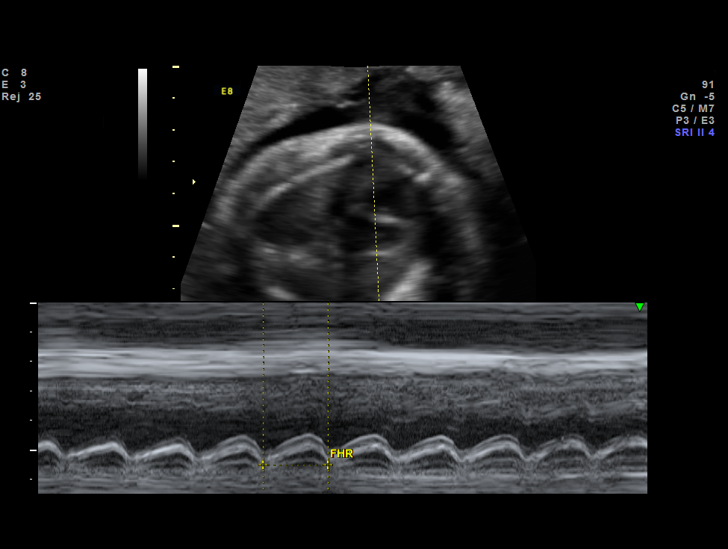
[im 4/16]
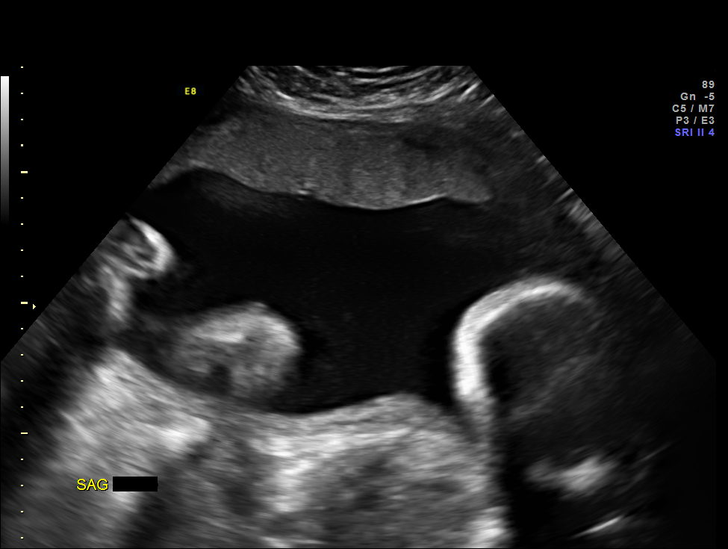
[im 5/16]
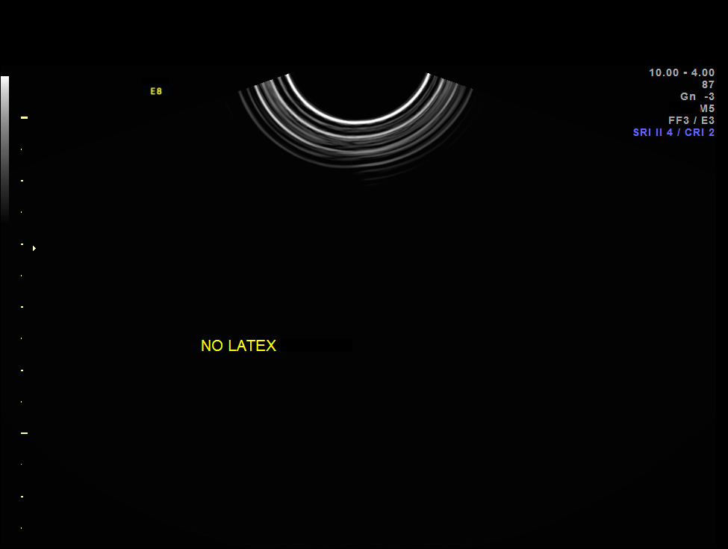
[im 6/16]
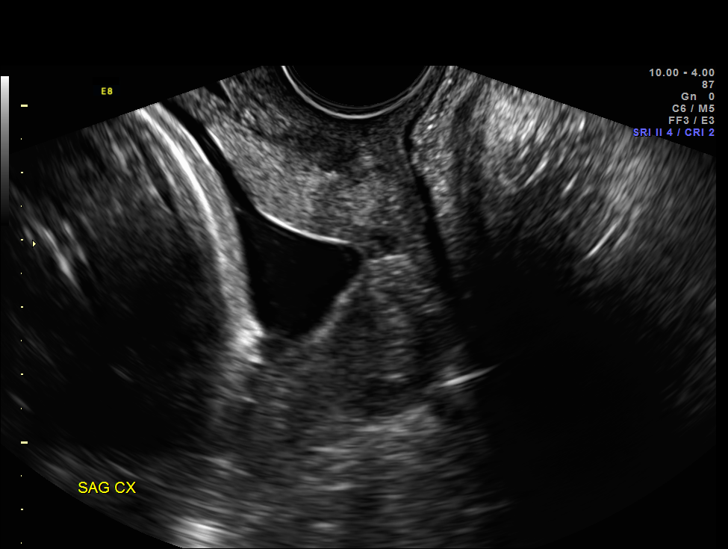
[im 7/16]
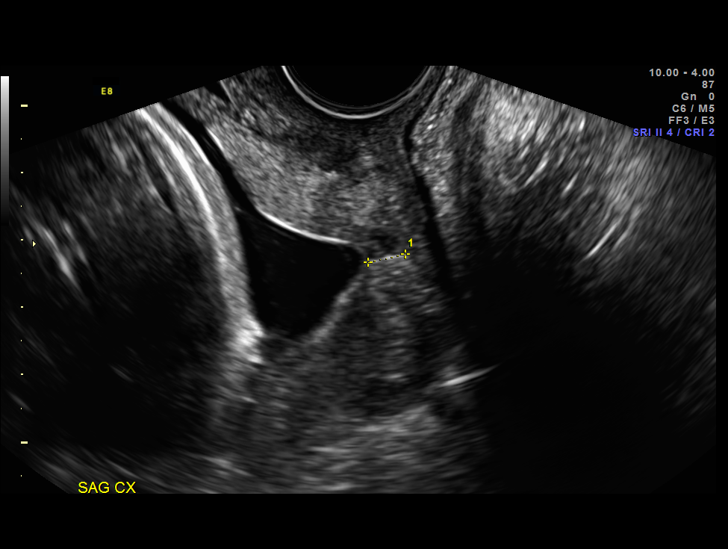
[im 9/16]
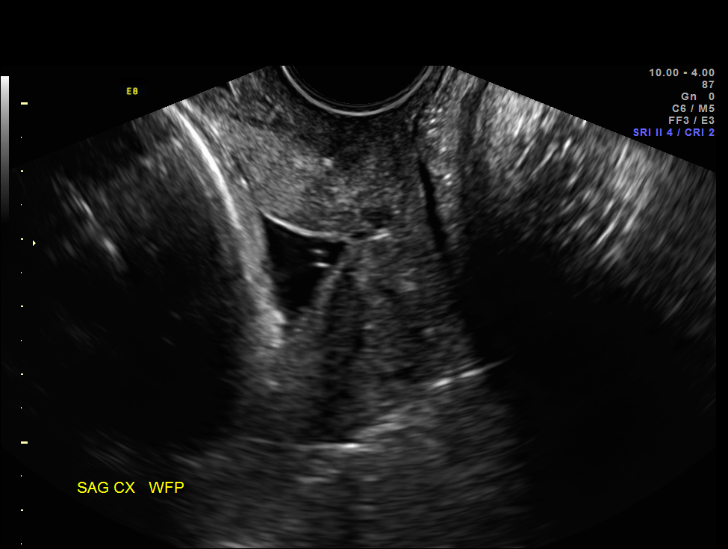
[im 10/16]
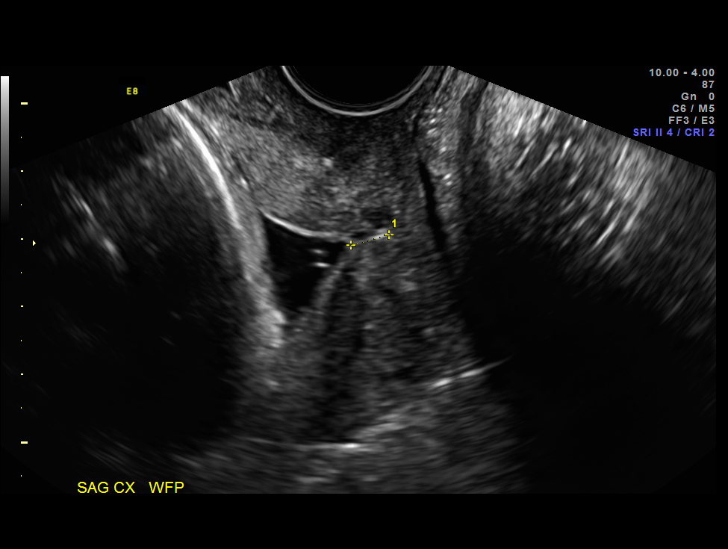
[im 11/16]
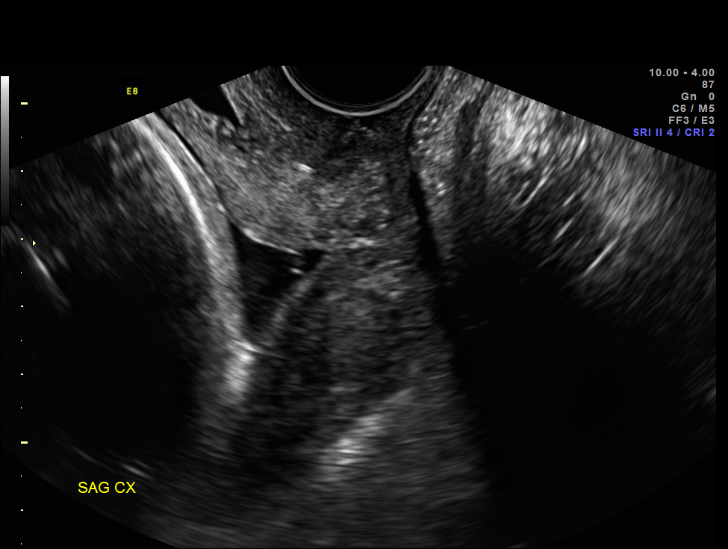
[im 12/16]
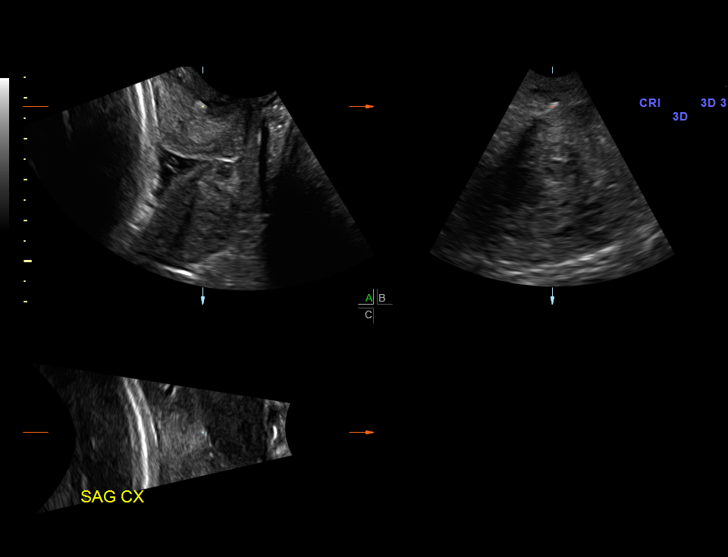
[im 13/16]
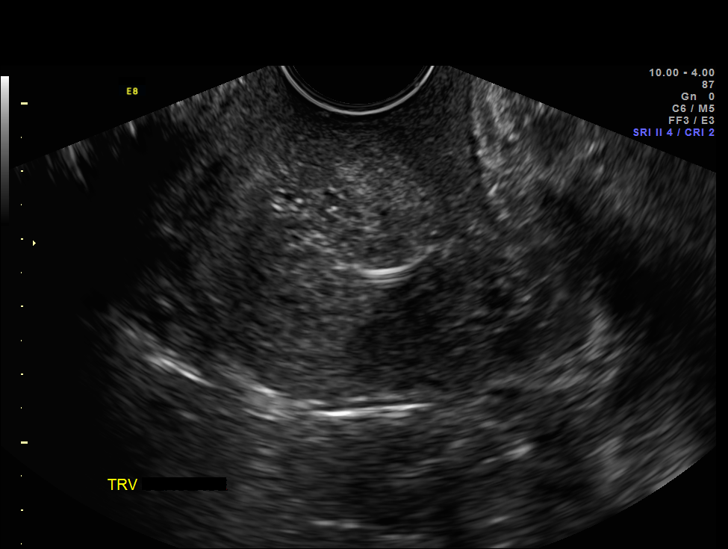
[im 15/16]
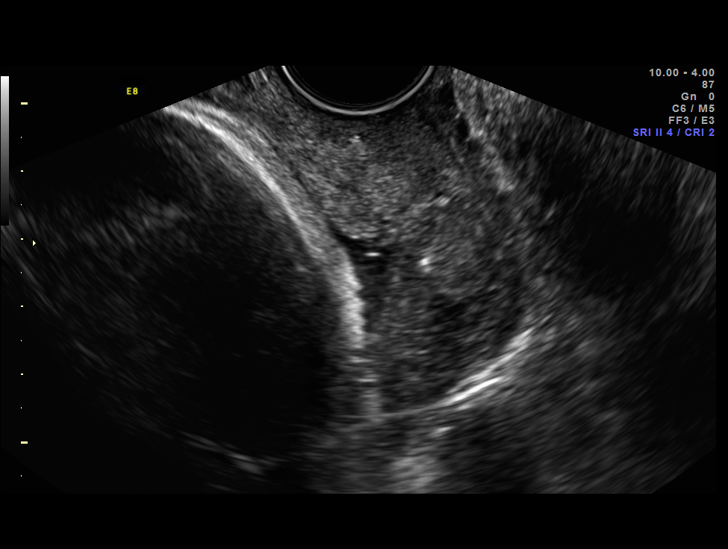
[im 16/16]
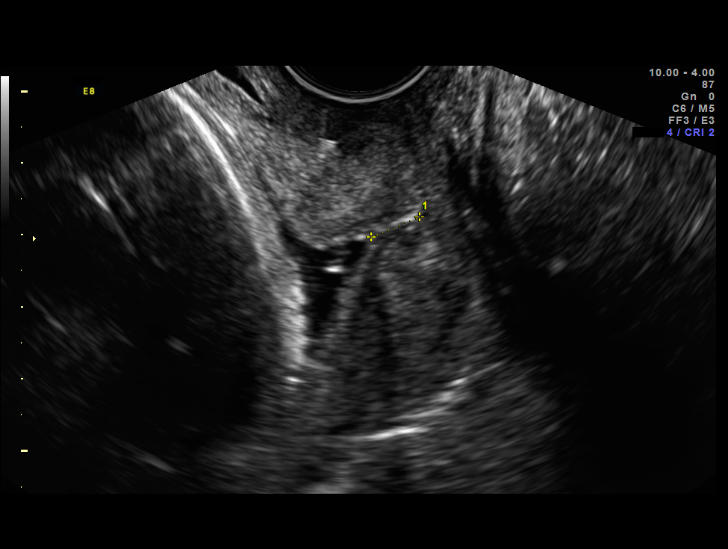

[13 of 16 positions shown; findings below may reference images not displayed]

OBSTETRICS REPORT
                      (Signed Final 08/07/2013 [DATE])

             TIGER

Service(s) Provided

 US MFM OB TRANSVAGINAL                                76817.2
Indications

 Poor obstetric history: Previous midtrimester loss
 23 weeks after PPROM at 21 weeks
 Poor obstetric history: Previous preterm delivery -
 on 17P
 Cervical insufficiency; S/P cerclage placement;
 funneling; S/P BMZ [DATE] & 9
Fetal Evaluation

 Num Of Fetuses:    1
 Fetal Heart Rate:  155                          bpm
 Cardiac Activity:  Observed
 Presentation:      Cephalic
 Placenta:          Anterior, above cervical os
 P. Cord            Previously Visualized
 Insertion:

 Amniotic Fluid
 AFI FV:      Subjectively within normal limits
Gestational Age

 Best:          30w 1d     Det. By:  Early Ultrasound         EDD:   10/15/13
                                     (03/24/13)
Cervix Uterus Adnexa

 Cervical Length:    0.5      cm

 Cervix:       Measured transvaginally. Cerclage visualized.
               Appears funnelled, see comments.
Impression

 Single IUP at 30 [DATE] weeks
 Cervical insufficiency s/p cerclage- completed course of
 Norita on [DATE] and [DATE]

 TVUS - cervical length 5 mm with V-shaped funneling.  The
 cerclage stitch appears intact (stable since previous
 evaluation)
Recommendations

 Follow-up ultrasounds as clinically indicated.

## 2015-06-22 ENCOUNTER — Emergency Department (HOSPITAL_COMMUNITY): Payer: Medicaid Other

## 2015-06-22 ENCOUNTER — Emergency Department (HOSPITAL_COMMUNITY)
Admission: EM | Admit: 2015-06-22 | Discharge: 2015-06-22 | Disposition: A | Discharge status: Home / Self Care | Payer: Self-pay | Attending: Emergency Medicine | Admitting: Emergency Medicine

## 2015-06-22 ENCOUNTER — Encounter (HOSPITAL_COMMUNITY): Payer: Self-pay | Admitting: *Deleted

## 2015-06-22 DIAGNOSIS — R569 Unspecified convulsions: Secondary | ICD-10-CM | POA: Insufficient documentation

## 2015-06-22 DIAGNOSIS — F53 Puerperal psychosis: Secondary | ICD-10-CM | POA: Insufficient documentation

## 2015-06-22 DIAGNOSIS — Z3202 Encounter for pregnancy test, result negative: Secondary | ICD-10-CM | POA: Insufficient documentation

## 2015-06-22 DIAGNOSIS — R110 Nausea: Secondary | ICD-10-CM | POA: Insufficient documentation

## 2015-06-22 DIAGNOSIS — Z862 Personal history of diseases of the blood and blood-forming organs and certain disorders involving the immune mechanism: Secondary | ICD-10-CM | POA: Insufficient documentation

## 2015-06-22 DIAGNOSIS — R42 Dizziness and giddiness: Secondary | ICD-10-CM | POA: Insufficient documentation

## 2015-06-22 DIAGNOSIS — Z8751 Personal history of pre-term labor: Secondary | ICD-10-CM | POA: Insufficient documentation

## 2015-06-22 HISTORY — DX: Anemia, unspecified: D64.9

## 2015-06-22 LAB — CBC WITH DIFFERENTIAL/PLATELET
BASOS ABS: 0 10*3/uL (ref 0.0–0.1)
Basophils Relative: 0 %
Eosinophils Absolute: 0.1 10*3/uL (ref 0.0–0.7)
Eosinophils Relative: 1 %
HCT: 37.4 % (ref 36.0–46.0)
HEMOGLOBIN: 12.4 g/dL (ref 12.0–15.0)
LYMPHS ABS: 1.7 10*3/uL (ref 0.7–4.0)
LYMPHS PCT: 25 %
MCH: 31.3 pg (ref 26.0–34.0)
MCHC: 33.2 g/dL (ref 30.0–36.0)
MCV: 94.4 fL (ref 78.0–100.0)
Monocytes Absolute: 0.6 10*3/uL (ref 0.1–1.0)
Monocytes Relative: 9 %
NEUTROS ABS: 4.3 10*3/uL (ref 1.7–7.7)
NEUTROS PCT: 65 %
Platelets: 312 10*3/uL (ref 150–400)
RBC: 3.96 MIL/uL (ref 3.87–5.11)
RDW: 12 % (ref 11.5–15.5)
WBC: 6.6 10*3/uL (ref 4.0–10.5)

## 2015-06-22 LAB — RAPID URINE DRUG SCREEN, HOSP PERFORMED
Amphetamines: NOT DETECTED
BENZODIAZEPINES: NOT DETECTED
Barbiturates: NOT DETECTED
COCAINE: NOT DETECTED
Opiates: NOT DETECTED
TETRAHYDROCANNABINOL: NOT DETECTED

## 2015-06-22 LAB — URINALYSIS, ROUTINE W REFLEX MICROSCOPIC
Bilirubin Urine: NEGATIVE
Glucose, UA: NEGATIVE mg/dL
Hgb urine dipstick: NEGATIVE
Ketones, ur: NEGATIVE mg/dL
LEUKOCYTES UA: NEGATIVE
NITRITE: NEGATIVE
PH: 7.5 (ref 5.0–8.0)
Protein, ur: NEGATIVE mg/dL
SPECIFIC GRAVITY, URINE: 1.005 (ref 1.005–1.030)

## 2015-06-22 LAB — BASIC METABOLIC PANEL
ANION GAP: 8 (ref 5–15)
BUN: 8 mg/dL (ref 6–20)
CALCIUM: 8.6 mg/dL — AB (ref 8.9–10.3)
CHLORIDE: 100 mmol/L — AB (ref 101–111)
CO2: 27 mmol/L (ref 22–32)
Creatinine, Ser: 0.86 mg/dL (ref 0.44–1.00)
GFR calc Af Amer: >60 mL/min (ref >60)
GFR calc non Af Amer: >60 mL/min (ref >60)
GLUCOSE: 64 mg/dL — AB (ref 65–99)
POTASSIUM: 4 mmol/L (ref 3.5–5.1)
Sodium: 135 mmol/L (ref 135–145)

## 2015-06-22 LAB — I-STAT BETA HCG BLOOD, ED (MC, WL, AP ONLY): I-stat hCG, quantitative: <5 m[IU]/mL (ref <5)

## 2015-06-22 LAB — TSH: TSH: 1.232 u[IU]/mL (ref 0.350–4.500)

## 2015-06-22 MED ORDER — SODIUM CHLORIDE 0.9 % IV BOLUS (SEPSIS)
1000.0000 mL | Freq: Once | INTRAVENOUS | Status: AC
  Administered 2015-06-22: 1000 mL via INTRAVENOUS

## 2015-06-22 MED ORDER — DEXTROSE 50 % IV SOLN
1.0000 | Freq: Once | INTRAVENOUS | Status: AC
  Administered 2015-06-22: 50 mL via INTRAVENOUS
  Filled 2015-06-22: qty 50

## 2015-06-22 MED ORDER — LORAZEPAM 1 MG PO TABS
1.0000 mg | ORAL_TABLET | Freq: Once | ORAL | Status: AC
  Administered 2015-06-22: 1 mg via ORAL
  Filled 2015-06-22: qty 1

## 2015-06-22 MED ORDER — LACTATED RINGERS IV BOLUS (SEPSIS)
1000.0000 mL | Freq: Once | INTRAVENOUS | Status: AC
  Administered 2015-06-22: 1000 mL via INTRAVENOUS

## 2015-06-22 NOTE — ED Notes (Signed)
Pt arrives via EMS from home. Family called EMS after witnessing pt have a grand mal seizure x 5 minutes. Denies history of seizures. EMS reports that pt was post ictal upon their arrival. Pt now close to baseline per sister. Pt was incontinent during seizure, vomited en route. EMS gave 4mg  zofran with relief of nausea. Pt states that she has been seeing urgent care frequently recently with c/o weakness, anemia, migraines, low appetite, hot flashes and constipation. EMS reports HR 120-130. Pt did not fall or hit her head during seizure.

## 2015-06-22 NOTE — ED Provider Notes (Addendum)
CSN: [id number]     Arrival date & time 06/22/15  1834 History   First MD Initiated Contact with Patient 06/22/15 1848     Chief Complaint  Patient presents with  . Seizures     (Consider location/radiation/quality/duration/timing/severity/associated sxs/prior Treatment) HPI Comments: PT comes in with cc of seizures. PT has hx of depression and anemia. No hx of seizures, head trauma, febrile seizures or brain infections when young. Pt reports feeling weak the last 2 days. Today, she started feeling nauseated and dizzy, and then per family - had a generalized tonic-clonic seizure like activity. Pt's symptoms started for 5 minutes, and she turned blue. Pt was confused post seizures and also had incontinence. Pt is very upset and tearful. She denies drug use, new meds, travels. She denies headaches, neck pain, numbness, tingling anywhere, or family hx of brain AB/bleeds.   ROS 10 Systems reviewed and are negative for acute change except as noted in the HPI.      Patient is a 35 y.o. female presenting with seizures. The history is provided by the patient.  Seizures   Past Medical History  Diagnosis Date  . Medical history non-contributory   . Depression     Postpartum after fetal demise  . Vaginal delivery 1996, [phone],2004,2010  . History of preterm delivery, currently pregnant 04/26/2013    17p > funneling to cerclage on 12/19 - repeat US in 2 weeks   . Anemia    Past Surgical History  Procedure Laterality Date  . Breast surgery    . Cervical cerclage N/A 05/22/2013    Procedure: CERCLAGE CERVICAL;  Surgeon: Adam PhenixJames G Arnold, MD;  Location: [facility];  Service: Gynecology;  Laterality: N/A;   Family History  Problem Relation Age of Onset  . Alcohol abuse Neg Hx   . Arthritis Neg Hx   . Asthma Neg Hx   . Birth defects Neg Hx   . Cancer Neg Hx   . COPD Neg Hx   . Depression Neg Hx   . Diabetes Neg Hx   . Drug abuse Neg Hx   . Early death Neg Hx   . Hearing loss Neg Hx   .  Heart disease Neg Hx   . Hypertension Neg Hx   . Hyperlipidemia Neg Hx   . Kidney disease Neg Hx   . Learning disabilities Neg Hx   . Mental illness Neg Hx   . Mental retardation Neg Hx   . Miscarriages / Stillbirths Neg Hx   . Stroke Neg Hx   . Vision loss Neg Hx    Social History  Substance Use Topics  . Smoking status: Never Smoker   . Smokeless tobacco: Never Used  . Alcohol Use: No   OB History    Gravida Para Term Preterm AB TAB SAB Ectopic Multiple Living   8 5 4 1 3  0 2 1 0 4     Review of Systems  Neurological: Positive for seizures.      Allergies  Review of patient's allergies indicates no known allergies.  Home Medications   Prior to Admission medications   Medication Sig Start Date End Date Taking? Authorizing Provider  traMADol (ULTRAM) 50 MG tablet Take 50 mg by mouth every 6 (six) hours as needed for moderate pain (headache).   Yes Historical Provider, MD   BP 122/90 mmHg  Pulse 105  Temp(Src) 97.9 F (36.6 C) (Oral)  Resp 17  Ht 5\' 2"  (1.575 m)  Wt 136 lb (61.689  kg)  BMI 24.87 kg/m2  SpO2 100%  LMP 06/15/2015 Physical Exam  Constitutional: She is oriented to person, place, and time. She appears well-developed.  HENT:  Head: Normocephalic and atraumatic.  No nystagmus  Eyes: Conjunctivae and EOM are normal. Pupils are equal, round, and reactive to light.  Neck: Normal range of motion. Neck supple.  Cardiovascular: Regular rhythm and normal heart sounds.   Pulmonary/Chest: Effort normal and breath sounds normal. No respiratory distress.  Abdominal: Soft. Bowel sounds are normal. She exhibits no distension. There is no tenderness. There is no rebound and no guarding.  Neurological: She is alert and oriented to person, place, and time. No cranial nerve deficit. Coordination normal.  Skin: Skin is warm and dry.  Nursing note and vitals reviewed.   ED Course  Procedures (including critical care time) Labs Review Labs Reviewed  BASIC  METABOLIC PANEL - Abnormal; Notable for the following:    Chloride 100 (*)    Glucose, Bld 64 (*)    Calcium 8.6 (*)    All other components within normal limits  CBC WITH DIFFERENTIAL/PLATELET  URINALYSIS, ROUTINE W REFLEX MICROSCOPIC (NOT AT [facility])  URINE RAPID DRUG SCREEN, HOSP PERFORMED  TSH  I-STAT BETA HCG BLOOD, ED ([facility], [facility], [facility] ONLY)    Imaging Review Ct Head Wo Contrast  06/22/2015  CLINICAL DATA:  New onset of seizure. EXAM: CT HEAD WITHOUT CONTRAST TECHNIQUE: Contiguous axial images were obtained from the base of the skull through the vertex without intravenous contrast. COMPARISON:  None. FINDINGS: No mass lesion. No midline shift. No acute hemorrhage or hematoma. No extra-axial fluid collections. No evidence of acute infarction. Brain parenchyma appears normal. No acute osseous abnormality. Previous ethmoid and right maxillary sinus surgery. Opacification of the frontal sinus and right anterior ethmoid air cells is most likely chronic. IMPRESSION: No acute abnormalities. Electronically Signed   By: Francene Boyers M.D.   On: 06/22/2015 19:51   I have personally reviewed and evaluated these images and lab results as part of my medical decision-making.   EKG Interpretation   Date/Time:  Sunday June 22 2015 18:44:14 EST Ventricular Rate:  116 PR Interval:  164 QRS Duration: 104 QT Interval:  326 QTC Calculation: 453 R Axis:   89 Text Interpretation:  Sinus tachycardia Consider right atrial enlargement  Borderline repolarization abnormality No acute changes Confirmed by  Rhunette Croft, MD, Janey Genta [phone]) on 06/22/2015 7:07:35 PM      MDM   Final diagnoses:  Seizure-like activity (HCC)   DDx: -Seizure disorder -Meningitis -Trauma -ICH -Electrolyte abnormality -Metabolic derangement -Stroke -Toxin induced seizures -Medication side effects -Hypoxia -Hypoglycemia  Pt with new onset seizure. Seizure witnessed by family, and appears to be grand mal seizure. She is back  to baseline. Hx and exam not indicative of the source. Basic labs ordered, and CT head ordered. We dont think pt has cerebellar or meningeal process.   Derwood Kaplan, MD 06/22/15 1948  10:54 PM Neuro recommends treating this as 1st time seizure and not start and keppra. Will d/c with neuro f/i. HR improved. PT reports feeling anxious - ativan ordered. Stable for d/c.  Derwood Kaplan, MD 06/22/15 2255

## 2015-06-22 NOTE — Discharge Instructions (Signed)
Please see the Neurologist for further evaluation. Please return to the ER if the seizure recurs.   Convulsiones en el adulto (Seizure, Adult) Neomia DearUna convulsin es una actividad elctrica anormal en el cerebro. Generalmente duran entre 30 segundos y 2 minutos. Hay varios tipos de convulsiones. Antes de una convulsin, puede tener una sensacin de advertencia (aura) que indica que la convulsin est a punto de Radiation protection practitionerocurrir. Un aura puede incluir los siguientes sntomas:   [place] o ansiedad.  Nuseas.  Sentir que la habitacin da vueltas (vrtigo).  Cambios en la visin, como ver destellos de luz o [place]. Los sntomas ms comunes durante un ataque son:  Un cambio en la atencin o el comportamiento (estado mental alterado).  Convulsiones con sacudidas rtmicas.  Babeo.  Movimientos rpidos de los ojos.  Gruidos.  Prdida del control del intestino y la vejiga.  Sabor amargo en la boca.  Mordeduras de [place]. Despus de un ataque, puede ser que se sienta confundido y adormilado. Tambin podra sufrir una lesin durante la convulsin. INSTRUCCIONES PARA EL CUIDADO EN EL HOGAR   Si le recetaron medicamentos, tmelos exactamente segn se lo indique su mdico.  Cumpla con todas las visitas de control, segn le indique su mdico.  No nade ni conduzca ni participe en actividades Tenet Healthcareriesgosas durante las que una convulsin podra causarle una lesin mayor a usted o a otros hasta que el mdico lo autorice.  Descanse adecuadamente.  Ensee a sus amigos y familiares lo que debe hacer si tiene una convulsin. Ellos deben:  Acostarlo en el suelo para evitar que se caiga.  Poner una almohada debajo de su cabeza.  Aflojar la ropa apretada alrededor de su cuello.  Recostarlo sobre un lado. En caso de tener vmitos, esto ayuda a CBS Corporationmantener las vas area despejadas.  Lennie HummerQuedarse con usted hasta que se recupere.  Sepa si necesita atencin de emergencia o no. SOLICITE ATENCIN MDICA DE INMEDIATO  SI:  La convulsin dura ms de cinco minutos.  La convulsin es grave o no se despierta de inmediato despus de la convulsin.  Tiene un estado mental alterado despus de la convulsin.  Tiene convulsiones ms frecuentes o ms graves. Alguien debe llevarlo al departamento de emergencias o llamar a los servicios de emergencias locales (911 en [place] ASEGRESE DE QUE:  Comprende estas instrucciones.  Controlar su afeccin.  Recibir ayuda de inmediato si no mejora o si empeora.   Esta informacin no tiene Theme park managercomo fin reemplazar el consejo del mdico. Asegrese de hacerle al mdico cualquier pregunta que tenga.   Document Released: 03/17/2005 Document Revised: 06/28/2014 Elsevier Interactive Patient Education [employer]2016 Elsevier Inc.

## 2015-07-09 ENCOUNTER — Ambulatory Visit (INDEPENDENT_AMBULATORY_CARE_PROVIDER_SITE_OTHER): Payer: Self-pay | Admitting: Family Medicine

## 2015-07-09 VITALS — BP 102/72 | HR 104 | Temp 98.2°F | Resp 16 | Ht 61.0 in | Wt 133.0 lb

## 2015-07-09 DIAGNOSIS — F411 Generalized anxiety disorder: Secondary | ICD-10-CM

## 2015-07-09 DIAGNOSIS — E162 Hypoglycemia, unspecified: Secondary | ICD-10-CM

## 2015-07-09 DIAGNOSIS — F32A Depression, unspecified: Secondary | ICD-10-CM

## 2015-07-09 DIAGNOSIS — R5382 Chronic fatigue, unspecified: Secondary | ICD-10-CM

## 2015-07-09 DIAGNOSIS — G44229 Chronic tension-type headache, not intractable: Secondary | ICD-10-CM

## 2015-07-09 DIAGNOSIS — R634 Abnormal weight loss: Secondary | ICD-10-CM

## 2015-07-09 DIAGNOSIS — F329 Major depressive disorder, single episode, unspecified: Secondary | ICD-10-CM

## 2015-07-09 MED ORDER — SERTRALINE HCL 50 MG PO TABS: ORAL_TABLET | ORAL | Status: DC

## 2015-07-09 MED ORDER — BUTALBITAL-APAP-CAFFEINE 50-325-40 MG PO TABS: 1.0000 | ORAL_TABLET | Freq: Four times a day (QID) | ORAL | Status: DC | PRN

## 2015-07-09 MED ORDER — CLONAZEPAM 1 MG PO TABS: ORAL_TABLET | ORAL | Status: DC

## 2015-07-09 NOTE — Patient Instructions (Addendum)
Trastorno de ansiedad generalizada (Generalized Anxiety Disorder) El trastorno de ansiedad generalizada es un trastorno mental. Interfiere en las funciones vitales, incluyendo las [place], el trabajo y la escuela.  Es diferente de la ansiedad normal que todas las personas experimentan en algn momento de su vida en respuesta a sucesos y Chief Operating Officer. En verdad, la ansiedad normal nos ayuda a prepararnos y Human resources officer acontecimientos y actividades de la vida. La ansiedad normal desaparece despus de que el evento o la actividad ha finalizado.  El trastorno de ansiedad generalizada no est necesariamente relacionada con eventos o actividades especficas. Tambin causa un exceso de ansiedad en proporcin a sucesos o actividades especficas. En este trastorno la ansiedad es difcil de Chief Operating Officer. Los sntomas pueden variar de leves a muy graves. Las personas que sufren de trastorno de ansiedad generalizada pueden tener intensas olas de ansiedad con sntomas fsicos (ataques de pnico).  SNTOMAS  La ansiedad y la preocupacin asociada a este trastorno son difciles de Chief Operating Officer. Esta ansiedad y la preocupacin estn relacionados con muchos eventos de la vida y sus actividades y tambin ocurre durante ms [employer] que no ocurre, durante 6 meses o ms. Las personas que la sufren pueden tener tres o ms de los siguientes sntomas (uno o ms en los nios):   Glass blower/designer.  Dificultades de concentracin.   Irritabilidad.  Tensin muscular  Dificultad para dormirse o sueo poco satisfactorio. DIAGNSTICO  Se diagnostica a travs de una evaluacin realizada por el mdico. El mdico le har preguntas acerca de su estado de nimo, sntomas fsicos y sucesos de [place] vida. Le har preguntas sobre su historia clnica, el consumo de alcohol o drogas, incluyendo los medicamentos recetados. [employer] un examen fsico e indicar anlisis de [place]. Ciertas enfermedades y el uso de  determinadas sustancias pueden causar sntomas similares a este trastorno. Su mdico lo puede derivar a Music therapist en salud mental para una evaluacin ms profunda.Gerlean Ren  Las terapias siguientes se utilizan en el tratamiento de este trastorno:   Medicamentos - Se recetan antidepresivos para el control diario a Air cabin crew. Pueden indicarse tambin medicamentos para combatir la [employer] graves, especialmente cuando ocurren ataques de pnico.   Terapia conversada (psicoterapia) Ciertos tipos de psicoterapia pueden ser tiles en el tratamiento del trastorno de ansiedad generalizada, proporcionando apoyo, educacin y Optometrist. Una forma de psicoterapia llamada terapia cognitivo-conductual puede ensearle formas saludables de pensar y Publishing rights manager a los eventos y actividades de la vida diaria.  Tcnicasde manejo del estrs- Estas tcnicas incluyen el yoga, la meditacin y el ejercicio y pueden ser muy tiles cuando se practican con regularidad. Un especialista en salud mental puede ayudar a determinar qu tratamiento es mejor para usted. Algunas personas obtienen mejora con una terapia. Sin embargo, Economist requieren una combinacin de terapias.    Esta informacin no tiene Theme park manager el consejo del mdico. Asegrese de hacerle al mdico cualquier pregunta que tenga.   Document Released: 10/02/2012 Document Revised: 06/28/2014 Elsevier Interactive Patient Education 2016 [employer].  Trastorno depresivo mayor (Major Depressive Disorder) El trastorno depresivo mayor es una enfermedad mental. Tambin se llamado depresin clnica o depresin unipolar. Produce sentimientos de tristeza, desesperanza o desamparo. Algunas personas con trastorno depresivo mayor no se sienten particularmente tristes, pero pierden Press photographer las cosas que solan disfrutar (anhedonia). Tambin puede causar sntomas fsicos. Interfiere en el trabajo, la escuela, las  relaciones y otras actividades diarias normales. Puede variar en  gravedad, pero es ms duradera y ms grave que la tristeza que todos sentimos de vez en cuando en nuestras vidas.  Muchas veces es desencadenada por sucesos estresantes o cambios importantes en la vida. Algunos ejemplos de estos factores desencadenantes son el divorcio, la prdida del trabajo o el hogar, [place], y la muerte de un familiar o amigo cercano. A veces aparece sin ninguna razn evidente. Las personas que tienen familiares con depresin mayor o con trastorno bipolar tienen ms riesgo de desarrollar depresin mayor con o sin factores de Librarian, academic. Puede ocurrir a Actuary. Puede ocurrir slo una vez en su vida(episodio nico de trastorno depresivo mayor). Puede ocurrir varias veces (trastorno depresivo mayor recurrente).  SNTOMAS  Las personas con trastorno depresivo mayor presentan anhedonia o estado de nimo deprimido casi [employer] durante al menos 2 semanas o ms. Los sntomas son:   Sensacin de tristeza Designer, jewellery) o vaco.  Sentimientos de desesperanza o desamparo.  Lagrimeo o episodios de llanto ( es observado por los dems).  Irritabilidad (en nios y adolescentes). Adems del estado de nimo deprimido o la anhedonia o ambos, estos enfermos tienen al menos cuatro de los siguientes sntomas :   Dificultad para dormir o Engineer, site.   Cambio significativo (aumento o disminucin) en el apetito o Education officer, community.   Falta de energa o motivacin.  Sentimientos de culpa o desvalorizacin.   Dificultad para concentrarse, recordar o tomar decisiones.  Movimientos inusualmente lentos (retardo psicomotor retardation) o inquietud (segn lo observado por los dems).   Deseos recurrentes de muerte, pensamientos recurrentes de autoagresin (suicidio) o intento de suicidio. Las personas con trastorno depresivo mayor suelen tener pensamientos persistentes negativos acerca de s mismos, de otras personas y  del mundo. Las personas con trastorno depresivo mayor grave pueden experimentar creencias o percepciones distorsionadas sobre el mundo (delirios psicticos). Tambin pueden ver u or cosas que no son reales(alucinaciones psicticas).  DIAGNSTICO  El diagnstico se realiza mediante una evaluacin hecha por el mdico. El mdico le preguntar acerca de los aspectos de su vida cotidiana, como el [place] de nimo, el sueo y el apetito, para ver si usted tiene los sntomas de Animator. Le har preguntas sobre su historial mdico y el consumo de alcohol o drogas, incluyendo medicamentos recetados. [employer] un examen fsico y Education administrator anlisis de [place]. Esto se debe a que ciertas enfermedades y el uso de determinadas sustancias pueden causar sntomas similares a la depresin (depresin secundaria). Su mdico tambin podra derivarlo a Proofreader salud mental para Neomia Dear evaluacin y [place].  TRATAMIENTO  Es Public librarian los sntomas y Forensic scientist. Los siguientes tratamientos pueden indicarse:    Medicamentos - Generalmente se recetan antidepresivos. Los antidepresivos se piensa que corrigen los desequilibrios qumicos en el cerebro que se asocian comnmente a la depresin Forensic scientist. Se pueden agregar otros tipos de medicamentos si los sntomas no responden a los antidepresivos solos o si hay ideas delirantes o alucinaciones psicticas.  Psicoterapia - ciertos tipos de psicoterapia pueden ser tiles en el tratamiento del trastorno de la depresin mayor, proporcionando apoyo, educacin y Optometrist. Ciertos tipos de psicoterapia tambin pueden ayudar a superar los pensamientos negativos (terapia cognitivo conductual) y a problemas de relacin que desencadena la depresin mayor (terapia interpersonal). Un especialista en salud mental puede ayudarlo a determinar qu tratamiento es el mejor para usted. La [employer] de los pacientes mejoran con una combinacin de medicacin y  psicoterapia. Los tratamientos que implican la estimulacin elctrica del cerebro  pueden ser [employer] en situaciones con sntomas muy graves o cuando los medicamentos y la psicoterapia no funcionan despus de un [place]. Estos tratamientos incluyen terapia electroconvulsiva, estimulacin magntica transcraneal y estimulacin del nervio vago.    Esta informacin no tiene Theme park manager el consejo del mdico. Asegrese de hacerle al mdico cualquier pregunta que tenga.   Document Released: 10/02/2012 Document Revised: 06/28/2014 Elsevier Interactive Patient Education [employer]

## 2015-07-09 NOTE — Progress Notes (Addendum)
Subjective:  By signing my name below, I, Veronica Mccormick, attest that this documentation has been prepared under the direction and in the presence of Veronica Sorenson, MD. Electronically Signed: Stann Mccormick, Scribe. 07/09/2015 , 3:10 PM .  Patient was seen in Room 10 .   Patient ID: Veronica Mccormick, female    DOB: 30-Oct-1980, 35 y.o.   MRN: [id number] Chief Complaint  Patient presents with  . Fatigue    x 6 months  . Headache  . Loss of Appetite  . Seizures    Jan 1  . Depression   HPI Veronica Mccormick is a 35 y.o. female who has h/o anemia presents to [facility] complaining of fatigue with headache that's been going on for 6-8 months.   Fatigue She can't sleep well at night due to anxiety, thinking everything. She had intermittent sleep last night, waking up periodically. She took [place] when she was pregnant to help her sleep. She denies lightheadedness and dizziness when standing up.   Headaches She had a headache a few days ago, located around her temples bilaterally radiating to the back of her head. She takes 3 advil for the headaches, only when needed.   Seizure She had a seizure on Jan 1st, and that was her first seizure. She went to the ED that night. They measured her sugar being really low. She was feeling bad 3 days prior so she hasn't eaten much for past few days before the seizure.   Depression She also notes having depression with loss of appetite. She's only eaten crackers and drank water today. She had chills when she would have dysphoric episodes. The episodes would last about a day, and would vomit as well. She denies taking any medications for depression. She has an appointment with neurologist (Dr. Karel Jarvis) scheduled for Feb 2nd. She's been losing weight lately, and she wants to be able to gain the weight back. She was given remeron while in [place] but it made her very sleepy.   Personal She has 4 children. Her youngest child is 46.62 years old. Her periods are  normal. She takes birth control pills. She denies complications with them. She denies family history of chronic illnesses. She denies taking vitamins.   Language She had a family member with her that translated for her in the room. Her primary language is [place].   Past Medical History  Diagnosis Date  . Medical history non-contributory   . Depression     Postpartum after fetal demise  . Vaginal delivery 1996, [phone]  . History of preterm delivery, currently pregnant 04/26/2013    17p > funneling to cerclage on 12/19 - repeat US in 2 weeks   . Anemia    Prior to Admission medications   Medication Sig Start Date End Date Taking? Authorizing Provider  traMADol (ULTRAM) 50 MG tablet Take 50 mg by mouth every 6 (six) hours as needed for moderate pain (headache). Reported on 07/09/2015    Historical Provider, MD   No Known Allergies  Review of Systems  Constitutional: Positive for chills, diaphoresis, appetite change, fatigue and unexpected weight change. Negative for fever.  Respiratory: Negative for shortness of breath.   Cardiovascular: Negative for chest pain.  Genitourinary: Negative for menstrual problem.  Neurological: Positive for seizures and headaches. Negative for light-headedness.  Psychiatric/Behavioral: Positive for sleep disturbance and dysphoric mood.      Objective:   Physical Exam  Constitutional: She is oriented to person, place, and time. She appears well-developed  and well-nourished. No distress.  HENT:  Head: Normocephalic and atraumatic.  Eyes: EOM are normal. Pupils are equal, round, and reactive to light.  Neck: Neck supple.  Cardiovascular: Regular rhythm and normal heart sounds.  Tachycardia present.   Pulmonary/Chest: Effort normal and breath sounds normal. No respiratory distress. She has no wheezes.  Musculoskeletal: Normal range of motion.  Neurological: She is alert and oriented to person, place, and time.  Skin: Skin is warm and dry.    Psychiatric: She has a normal mood and affect. Her behavior is normal.  Nursing note and vitals reviewed.    BP 102/72 mmHg  Pulse 104  Temp(Src) 98.2 F (36.8 C)  Resp 16  Ht  (1.549 m)  Wt 133 lb (60.328 kg)  BMI 25.14 kg/m2  SpO2 98%  LMP 06/15/2015     Results for orders placed or performed during the hospital encounter of 06/22/15  CBC with Differential/Platelet  Result Value Ref Range   WBC 6.6 4.0 - 10.5 K/uL   RBC 3.96 3.87 - 5.11 MIL/uL   Hemoglobin 12.4 12.0 - 15.0 g/dL   HCT 65.7 84.6 - 96.2 %   MCV 94.4 78.0 - 100.0 fL   MCH 31.3 26.0 - 34.0 pg   MCHC 33.2 30.0 - 36.0 g/dL   RDW 95.2 84.1 - 32.4 %   Platelets 312 150 - 400 K/uL   Neutrophils Relative % 65 %   Neutro Abs 4.3 1.7 - 7.7 K/uL   Lymphocytes Relative 25 %   Lymphs Abs 1.7 0.7 - 4.0 K/uL   Monocytes Relative 9 %   Monocytes Absolute 0.6 0.1 - 1.0 K/uL   Eosinophils Relative 1 %   Eosinophils Absolute 0.1 0.0 - 0.7 K/uL   Basophils Relative 0 %   Basophils Absolute 0.0 0.0 - 0.1 K/uL  Basic metabolic panel  Result Value Ref Range   Sodium 135 135 - 145 mmol/L   Potassium 4.0 3.5 - 5.1 mmol/L   Chloride 100 (L) 101 - 111 mmol/L   CO2 27 22 - 32 mmol/L   Glucose, Bld 64 (L) 65 - 99 mg/dL   BUN 8 6 - 20 mg/dL   Creatinine, Ser 4.01 0.44 - 1.00 mg/dL   Calcium 8.6 (L) 8.9 - 10.3 mg/dL   GFR calc non Af Amer >60 >60 mL/min   GFR calc Af Amer >60 >60 mL/min   Anion gap 8 5 - 15  Urinalysis, Routine w reflex microscopic (not at [facility])  Result Value Ref Range   Color, Urine YELLOW YELLOW   APPearance CLEAR CLEAR   Specific Gravity, Urine 1.005 1.005 - 1.030   pH 7.5 5.0 - 8.0   Glucose, UA NEGATIVE NEGATIVE mg/dL   Hgb urine dipstick NEGATIVE NEGATIVE   Bilirubin Urine NEGATIVE NEGATIVE   Ketones, ur NEGATIVE NEGATIVE mg/dL   Protein, ur NEGATIVE NEGATIVE mg/dL   Nitrite NEGATIVE NEGATIVE   Leukocytes, UA NEGATIVE NEGATIVE  Urine rapid drug screen (hosp performed)  Result Value Ref  Range   Opiates NONE DETECTED NONE DETECTED   Cocaine NONE DETECTED NONE DETECTED   Benzodiazepines NONE DETECTED NONE DETECTED   Amphetamines NONE DETECTED NONE DETECTED   Tetrahydrocannabinol NONE DETECTED NONE DETECTED   Barbiturates NONE DETECTED NONE DETECTED  TSH  Result Value Ref Range   TSH 1.232 0.350 - 4.500 uIU/mL  I-Stat Beta hCG blood, ED ([facility], [facility], [facility] only)  Result Value Ref Range   I-stat hCG, quantitative <5.0 <5 mIU/mL  Comment 3            Assessment & Plan:   1. Depression   2. Anxiety state   3. Chronic tension-type headache, not intractable   4. Chronic fatigue   5. Loss of weight   6. Hypoglycemia   7.     Hypocalcemia  Pt had above lab w/u in ER <3 wks prior and her sxs have all been for many mos so will hold off on repeating labs at this point.  It is possible that the wide variety of her sxs could be due to mood d/o so rec therapeutic trial with anti-depressant and anxiolytic.  If no improvement on these, then RTC for repeat eval with labs and consider imaging. Fortunately, pt does have an appt with Dr. Karel Jarvis at [facility] 2/2 - in 2 wks. Advise STOP tramadol since that can lower the seizure threshold.  Continue to not overuse nsaids Sounds like she was tried on remeron prior but did not understand that it was supposed to help her sleep so she was taking it in the morning and couldn't tolerate the sedation - could retry this qhs if necessary. Pt may also be a good candidate for topamax considering recent seizure and chronic daily HAs.  Hopefully starting her on a bzd at night will help her rest as well as prevent any additional seizures - certainly the reports of more nighttime agitation, sleep talking, tonic-clonic activity at night while sleeping over the past few wks is interesting and concerning so glad she is going to see neuro soon. . . .  Meds ordered this encounter  Medications  . sertraline (ZOLOFT) 50 MG tablet    Sig: Take 1/2 tab po qd  x 2 wks, then 1 tab po qd    Dispense:  30 tablet    Refill:  3  . butalbital-acetaminophen-caffeine (FIORICET) 50-325-40 MG tablet    Sig: Take 1-2 tablets by mouth every 6 (six) hours as needed for headache.    Dispense:  20 tablet    Refill:  0  . clonazePAM (KLONOPIN) 1 MG tablet    Sig: Take 1 tab po qhs as needed for sleep. Can take 1/4 to 1/2 tab during day as needed for anxiety attacks    Dispense:  30 tablet    Refill:  0    I personally performed the services described in this documentation, which was scribed in my presence. The recorded information has been reviewed and considered, and addended by me as needed.  Veronica Sorenson, MD MPH

## 2015-07-24 ENCOUNTER — Ambulatory Visit (INDEPENDENT_AMBULATORY_CARE_PROVIDER_SITE_OTHER): Payer: Self-pay | Admitting: Neurology

## 2015-07-24 ENCOUNTER — Encounter: Payer: Self-pay | Admitting: Neurology

## 2015-07-24 VITALS — BP 102/78 | HR 93 | Resp 16 | Wt 133.0 lb

## 2015-07-24 DIAGNOSIS — F329 Major depressive disorder, single episode, unspecified: Secondary | ICD-10-CM

## 2015-07-24 DIAGNOSIS — R569 Unspecified convulsions: Secondary | ICD-10-CM | POA: Insufficient documentation

## 2015-07-24 DIAGNOSIS — F32A Depression, unspecified: Secondary | ICD-10-CM

## 2015-07-24 NOTE — Progress Notes (Signed)
NEUROLOGY CONSULTATION NOTE  Veronica Mccormick MRN: [id number] DOB: Aug 21, 1980  Referring provider: Dr. Derwood Kaplan (ER) Primary care provider: Dr. [name]  Reason for consult:  Seizure on 06/22/15  Dear Dr Rhunette Croft:  Thank you for your kind referral of Veronica Mccormick for consultation of the above symptoms. Although her history is well known to you, please allow me to reiterate it for the purpose of our medical record. The patient was accompanied to the clinic by sister who also provides collateral information. A Spanish medical interpreter helps with translation. Records and images were personally reviewed where available.  HISTORY OF PRESENT ILLNESS: This is a 35 year old right-handed woman with a history of depression, in her usual state of health until 06/22/2015 when she had a witnessed seizure at home. Her sister reports that since it was New Year's eve the night prior, she went to bed late. She was already feeling ill the day prior and did not eat, then the next morning woke up feeling really ill with nausea and not wanting to eat anything. She was feeling sleepy, eating fruit and lay on the couch, when family witnessed her stiffen up followed by generalized shaking with her eyes rolled back and mouth open. The seizure lasted 5 minutes, with associated urinary incontinence. After the convulsion, she was unresponsive and was starting to turn purple, family sprayed perfume on her face and she started coming to. She was confused on EMS arrival, and recalls the ambulance drive, denies any focal weakness. She denied any alcohol intake the night prior. They deny any prior history of seizures. She has been having headaches for the past 6 months, with pressure in the occipital or temporal regions, mostly at night, lasting for 2 hours or so. She had been taking Advil, then over the past 5 months has been taking Tramadol sometimes on a daily basis, other times 3 times a week. She saw  her PCP last month and was started on Zoloft, and reports the headaches are less. She was also started on clonazepam after the seizure for sleep/anxiety. Prior to the seizure, she was having episodes where her body would feel very hot and sweaty, then she would vomit, separate from the headaches. These have stopped after the seizure. She has occasional tingling in her left arm. She was having weakness with blurred vision once a week, this has improved as well. She denies any dizziness, diplopia, dysarthria/dysphagia, neck/back pain, bowel/bladder dysfunction. She has had some jaw pain since the seizure. She has also noticed memory problems since the seizure, she forgets things she was told to do. She was driving yesterday but did not know why she took the freeway. Her sister has noticed that since the seizure, she would sometimes look like she is staring off, but would respond when called. She denies any denies any olfactory/gustatory hallucinations, deja vu, rising epigastric sensation, focal numbness/tingling/weakness, myoclonic jerks. Her father had a seizure that they report was provoked by hot weather and Red Bull intake. Otherwise had a normal birth and early development.  There is no history of febrile convulsions, CNS infections such as meningitis/encephalitis, significant traumatic brain injury, neurosurgical procedures.  She was brought to [facility] where CBC, BMP, UDS were negative. Her glucose was 64, her sister reports EMS told them glucose was low on arrival, but no values available for review. I personally reviewed head CT without contrast done 06/22/15 which did not show any acute changes.  Laboratory Data:  Lab Results  Component Value Date   WBC 6.6 06/22/2015   HGB 12.4 06/22/2015   HCT 37.4 06/22/2015   MCV 94.4 06/22/2015   PLT 312 06/22/2015     Chemistry      Component Value Date/Time   NA 135 06/22/2015 1855   K 4.0 06/22/2015 1855   CL 100* 06/22/2015 1855   CO2 27 06/22/2015  1855   BUN 8 06/22/2015 1855   CREATININE 0.86 06/22/2015 1855      Component Value Date/Time   CALCIUM 8.6* 06/22/2015 1855     Lab Results  Component Value Date   TSH 1.232 06/22/2015      PAST MEDICAL HISTORY: Past Medical History  Diagnosis Date  . Medical history non-contributory   . Depression     Postpartum after fetal demise  . Vaginal delivery 1996, [phone]  . History of preterm delivery, currently pregnant 04/26/2013    17p > funneling to cerclage on 12/19 - repeat US in 2 weeks   . Anemia     PAST SURGICAL HISTORY: Past Surgical History  Procedure Laterality Date  . Breast surgery    . Cervical cerclage N/A 05/22/2013    Procedure: CERCLAGE CERVICAL;  Surgeon: Adam Phenix, MD;  Location: [facility];  Service: Gynecology;  Laterality: N/A;    MEDICATIONS: Current Outpatient Prescriptions on File Prior to Visit  Medication Sig Dispense Refill  . butalbital-acetaminophen-caffeine (FIORICET) 50-325-40 MG tablet Take 1-2 tablets by mouth every 6 (six) hours as needed for headache. 20 tablet 0  . sertraline (ZOLOFT) 50 MG tablet Take 1/2 tab po qd x 2 wks, then 1 tab po qd 30 tablet 3  . clonazePAM (KLONOPIN) 1 MG tablet Take 1 tab po qhs as needed for sleep. Can take 1/4 to 1/2 tab during day as needed for anxiety attacks (Patient not taking: Reported on 07/24/2015) 30 tablet 0   No current facility-administered medications on file prior to visit.    ALLERGIES: No Known Allergies  FAMILY HISTORY: Family History  Problem Relation Age of Onset  . Alcohol abuse Neg Hx   . Arthritis Neg Hx   . Asthma Neg Hx   . Birth defects Neg Hx   . Cancer Neg Hx   . COPD Neg Hx   . Depression Neg Hx   . Diabetes Neg Hx   . Drug abuse Neg Hx   . Early death Neg Hx   . Hearing loss Neg Hx   . Heart disease Neg Hx   . Hypertension Neg Hx   . Hyperlipidemia Neg Hx   . Kidney disease Neg Hx   . Learning disabilities Neg Hx   . Mental illness Neg Hx   . Mental  retardation Neg Hx   . Miscarriages / Stillbirths Neg Hx   . Stroke Neg Hx   . Vision loss Neg Hx     SOCIAL HISTORY: Social History   Social History  . Marital Status: Single    Spouse Name: N/A  . Number of Children: N/A  . Years of Education: N/A   Occupational History  . Not on file.   Social History Main Topics  . Smoking status: Never Smoker   . Smokeless tobacco: Never Used  . Alcohol Use: No  . Drug Use: No  . Sexual Activity: Yes    Birth Control/ Protection: None   Other Topics Concern  . Not on file   Social History Narrative    REVIEW OF SYSTEMS: Constitutional: No fevers, chills,  or sweats, no generalized fatigue, change in appetite Eyes: No visual changes, double vision, eye pain Ear, nose and throat: No hearing loss, ear pain, nasal congestion, sore throat Cardiovascular: No chest pain, palpitations Respiratory:  No shortness of breath at rest or with exertion, wheezes GastrointestinaI: No nausea, vomiting, diarrhea, abdominal pain, fecal incontinence Genitourinary:  No dysuria, urinary retention or frequency Musculoskeletal:  No neck pain, back pain Integumentary: No rash, pruritus, skin lesions Neurological: as above Psychiatric: + depression,no insomnia, +anxiety Endocrine: No palpitations, fatigue, diaphoresis, mood swings, change in appetite, change in weight, increased thirst Hematologic/Lymphatic:  No anemia, purpura, petechiae. Allergic/Immunologic: no itchy/runny eyes, nasal congestion, recent allergic reactions, rashes  PHYSICAL EXAM: Filed Vitals:   07/24/15 0855  BP: 102/78  Pulse: 93  Resp: 16   General: No acute distress Head:  Normocephalic/atraumatic Eyes: Fundoscopic exam shows bilateral sharp discs, no vessel changes, exudates, or hemorrhages Neck: supple, no paraspinal tenderness, full range of motion Back: No paraspinal tenderness Heart: regular rate and rhythm Lungs: Clear to auscultation bilaterally. Vascular: No  carotid bruits. Skin/Extremities: No rash, no edema Neurological Exam: Mental status: alert and oriented to person, place, and time, no dysarthria or aphasia, Fund of knowledge is appropriate.  Recent and remote memory are intact. 3/3 delayed recall. Attention and concentration are normal.    Able to name objects and repeat phrases. Cranial nerves: CN I: not tested CN II: pupils equal, round and reactive to light, visual fields intact, fundi unremarkable. CN III, IV, VI:  full range of motion, no nystagmus, no ptosis CN V: facial sensation intact CN VII: upper and lower face symmetric CN VIII: hearing intact to finger rub CN IX, X: gag intact, uvula midline CN XI: sternocleidomastoid and trapezius muscles intact CN XII: tongue midline Bulk & Tone: normal, no fasciculations. Motor: 5/5 throughout with no pronator drift. Sensation: intact to light touch, cold, pin, vibration and joint position sense.  No extinction to double simultaneous stimulation.  Romberg test negative Deep Tendon Reflexes: +2 throughout, no ankle clonus Plantar responses: downgoing bilaterally Cerebellar: no incoordination on finger to nose, heel to shin. No dysdiadochokinesia Gait: narrow-based and steady, able to tandem walk adequately. Tremor: none  IMPRESSION: This is a 35 year old right-handed woman with a history of depression, presenting for new onset seizure last 06/22/15. Her neurological exam is normal, head CT normal. It appears she was feeling unwell since the day prior to the seizure with report of low glucose, but this was 64 in the ER. We discussed that after an initial seizure, unless there are significant risk factors, an abnormal neurological exam, an EEG showing epileptiform abnormalities, and/or abnormal neuroimaging, treatment with an antiepileptic drug is not indicated. We discussed 10% of the population may have a single seizure. Patients with a single unprovoked seizure have a recurrence rate of 33%  after a single seizure and 73% after a second seizure. A 1-hour sleep deprived EEG will be ordered to assess for focal abnormalities that increase risk for recurrent seizures. She had been having frequent headaches prior to the seizure, which has improved with starting an SSRI. She was advised to stop Tramadol intake and eat regular meals. We discussed [place] driving laws that indicate a patient needs to free of seizures or events of altered awareness for 6 months prior to resuming driving. The patient agreed to comply with these restrictions.  She will follow-up in 2 months and knows to call for any changes.  Thank you for allowing me to participate  in the care of this patient. Please do not hesitate to call for any questions or concerns.   Patrcia Dolly, M.D.  CC: Dr. Clelia Croft

## 2015-07-24 NOTE — Patient Instructions (Signed)
1. Schedule 1-hour sleep-deprived EEG 2. Avoid Tramadol 3. Eat regular meals 4. As per [place] driving laws, no driving after a seizure until 6 months seizure-free 5. Follow-up in 2 months

## 2015-07-28 ENCOUNTER — Encounter: Payer: Self-pay | Admitting: Neurology

## 2015-07-28 DIAGNOSIS — F329 Major depressive disorder, single episode, unspecified: Secondary | ICD-10-CM | POA: Insufficient documentation

## 2015-07-28 DIAGNOSIS — F32A Depression, unspecified: Secondary | ICD-10-CM | POA: Insufficient documentation

## 2015-07-31 ENCOUNTER — Other Ambulatory Visit: Payer: Self-pay | Admitting: Neurology

## 2015-08-18 ENCOUNTER — Ambulatory Visit: Payer: Self-pay | Admitting: Neurology

## 2015-08-18 ENCOUNTER — Other Ambulatory Visit: Payer: Self-pay | Admitting: Neurology

## 2015-08-18 DIAGNOSIS — Z029 Encounter for administrative examinations, unspecified: Secondary | ICD-10-CM

## 2015-09-30 ENCOUNTER — Ambulatory Visit: Payer: Self-pay | Admitting: Neurology

## 2015-09-30 DIAGNOSIS — Z029 Encounter for administrative examinations, unspecified: Secondary | ICD-10-CM

## 2015-10-21 ENCOUNTER — Ambulatory Visit (INDEPENDENT_AMBULATORY_CARE_PROVIDER_SITE_OTHER): Payer: Self-pay | Admitting: Family Medicine

## 2015-10-21 VITALS — BP 104/66 | HR 94 | Temp 98.2°F | Resp 16 | Ht 62.0 in | Wt 131.0 lb

## 2015-10-21 DIAGNOSIS — R519 Headache, unspecified: Secondary | ICD-10-CM

## 2015-10-21 DIAGNOSIS — R51 Headache: Secondary | ICD-10-CM

## 2015-10-21 DIAGNOSIS — F32A Depression, unspecified: Secondary | ICD-10-CM

## 2015-10-21 DIAGNOSIS — F329 Major depressive disorder, single episode, unspecified: Secondary | ICD-10-CM

## 2015-10-21 MED ORDER — SERTRALINE HCL 50 MG PO TABS: ORAL_TABLET | ORAL | Status: AC

## 2015-10-21 MED ORDER — BUTALBITAL-APAP-CAFFEINE 50-325-40 MG PO TABS: 1.0000 | ORAL_TABLET | Freq: Four times a day (QID) | ORAL | Status: AC | PRN

## 2015-10-21 MED ORDER — CLONAZEPAM 1 MG PO TABS: ORAL_TABLET | ORAL | Status: AC

## 2015-10-21 NOTE — Progress Notes (Signed)
Patient ID: Veronica Mccormick, female    DOB: 1980-08-09  Age: 35 y.o. MRN: [id number]  Chief Complaint  Patient presents with  . Medication Refill    Subjective:   35 year old lady who is had problems with depression. Several months ago she was placed on sertraline, and did very well. She ran out 2 weeks ago and is doing more crying and having more nervousness and sleep problems. Her appetite is gotten worse again. She is not pregnant. She is on breast rope pills and does not anticipate pregnancy. She is not nursing. She was encouraged to get more regular exercise, she does walk her child around the neighborhood. She has a lot of friends here in [place] and is in a church community here. They moved to [facility] where she had no friends, which was the start of a lot of her depression problems. She denies any suicidal ideation. She does still have headaches and needs more headache pain pills.  Current allergies, medications, problem list, past/family and social histories reviewed.  Objective:  BP 104/66 mmHg  Pulse 94  Temp(Src) 98.2 F (36.8 C) (Oral)  Resp 16  Ht  (1.575 m)  Wt 131 lb (59.421 kg)  BMI 23.95 kg/m2  SpO2 97%  LMP 10/07/2015  Physical exam not done today.  Assessment & Plan:   Assessment: 1. Depression   2. Nonintractable headache, unspecified chronicity pattern, unspecified headache type       Plan: Will refill her medication. See instructions.  No orders of the defined types were placed in this encounter.    Meds ordered this encounter  Medications  . butalbital-acetaminophen-caffeine (FIORICET) 50-325-40 MG tablet    Sig: Take 1-2 tablets by mouth every 6 (six) hours as needed for headache.    Dispense:  40 tablet    Refill:  1  . clonazePAM (KLONOPIN) 1 MG tablet    Sig: Take 1 tab po qhs as needed for sleep. Can take 1/4 to 1/2 tab during day as needed for anxiety attacks    Dispense:  30 tablet    Refill:  3  . sertraline (ZOLOFT) 50  MG tablet    Sig: Take 1/2 tab po qd x 2 wks, then 1 tab po qd    Dispense:  30 tablet    Refill:  3         Patient Instructions   Take the sertraline daily for anxiety and depression  Take the clonazepam only when needed. This medicine can be dependency forming if taken too much. He works best if you do not take it every day, but only use it when you are really unable to sleep or are too anxious.  Take the butalbital headache pills only when needed for severe headache.  For mild headache take acetaminophen (Tylenol) 1000 mg every 8 hours or ibuprofen (Advil) 600 mg every 8 hours  If worse at any time return  Plan to return in about late August for a recheck before your medicines run out  It would probably be good for you to consider finding a psychologist or counselor to talk. If you cannot find someone who is Spanish-speaking contact us and we can try and see if we can find someone. I do not know anyone myself.       IF you received an x-ray today, you will receive an invoice from [facility]. Please contact [facility] at [phone] with questions or concerns regarding your invoice.   IF you received labwork today,  you will receive an invoice from [facility]. Please contact [facility] at [phone] with questions or concerns regarding your invoice.   Our billing staff will not be able to assist you with questions regarding bills from these companies.  You will be contacted with the lab results as soon as they are available. The fastest way to get your results is to activate your My Chart account. Instructions are located on the last page of this paperwork. If you have not heard from us regarding the results in 2 weeks, please contact this office.         Return in about 4 months (around 02/21/2016).   HOPPER,DAVID, MD 10/21/2015

## 2015-10-21 NOTE — Patient Instructions (Addendum)
Take the sertraline daily for anxiety and depression  Take the clonazepam only when needed. This medicine can be dependency forming if taken too much. He works best if you do not take it every day, but only use it when you are really unable to sleep or are too anxious.  Take the butalbital headache pills only when needed for severe headache.  For mild headache take acetaminophen (Tylenol) 1000 mg every 8 hours or ibuprofen (Advil) 600 mg every 8 hours  If worse at any time return  Plan to return in about late August for a recheck before your medicines run out  It would probably be good for you to consider finding a psychologist or counselor to talk. If you cannot find someone who is Spanish-speaking contact us and we can try and see if we can find someone. I do not know anyone myself.       IF you received an x-ray today, you will receive an invoice from [facility]. Please contact [facility] at [phone] with questions or concerns regarding your invoice.   IF you received labwork today, you will receive an invoice from [facility]. Please contact [facility] at [phone] with questions or concerns regarding your invoice.   Our billing staff will not be able to assist you with questions regarding bills from these companies.  You will be contacted with the lab results as soon as they are available. The fastest way to get your results is to activate your My Chart account. Instructions are located on the last page of this paperwork. If you have not heard from us regarding the results in 2 weeks, please contact this office.

## 2017-02-17 ENCOUNTER — Ambulatory Visit (INDEPENDENT_AMBULATORY_CARE_PROVIDER_SITE_OTHER): Payer: Self-pay | Admitting: Neurology

## 2017-02-17 DIAGNOSIS — R569 Unspecified convulsions: Secondary | ICD-10-CM

## 2017-02-18 NOTE — Procedures (Signed)
ELECTROENCEPHALOGRAM REPORT  Date of Study: 02/17/2017  Patient's Name: Geanie LoganMaribel Rivera Salazar MRN: [id number] Date of Birth: 1981/06/16  Referring Provider: Dr. Patrcia DollyKaren Taveon Enyeart  Clinical History: This is a 36 year old woman with new onset seizure.  Medications: Fioricet, Zoloft, clonazepam  Technical Summary: A multichannel digital 1-hour sleep-deprived EEG recording measured by the international 10-20 system with electrodes applied with paste and impedances below 5000 ohms performed in our laboratory with EKG monitoring in an awake and asleep patient.  Hyperventilation and photic stimulation were performed.  The digital EEG was referentially recorded, reformatted, and digitally filtered in a variety of bipolar and referential montages for optimal display.    Description: The patient is awake and asleep during the recording.  During maximal wakefulness, there is a symmetric, medium voltage 9 Hz posterior dominant rhythm that attenuates with eye opening.  The record is symmetric.  During drowsiness and sleep, there is an increase in theta slowing of the background.  Vertex waves and symmetric sleep spindles were seen.  Hyperventilation and photic stimulation did not elicit any abnormalities.  There were no epileptiform discharges or electrographic seizures seen.    EKG lead was unremarkable.  Impression: This 1-hour awake and asleep EEG is normal.    Clinical Correlation: A normal EEG does not exclude a clinical diagnosis of epilepsy.  If further clinical questions remain, prolonged EEG may be helpful.  Clinical correlation is advised.   Patrcia DollyKaren Shakari Qazi, M.D.

## 2017-03-14 ENCOUNTER — Ambulatory Visit: Payer: Self-pay | Admitting: Neurology

## 2017-03-22 ENCOUNTER — Encounter: Payer: Self-pay | Admitting: Neurology

## 2017-03-24 ENCOUNTER — Telehealth: Payer: Self-pay | Admitting: Neurology

## 2017-03-24 NOTE — Telephone Encounter (Signed)
Patient dismissed from [facility] by Karen Aquino MD , effective March 22, 2017. Dismissal letter sent out by certified / registered mail.  °daj °

## 2017-03-24 NOTE — Telephone Encounter (Signed)
Patient dismissed from [facility] by Karen Aquino MD , effective March 22, 2017. Dismissal letter sent out by certified / registered mail.  °daj °

## 2017-04-11 NOTE — Telephone Encounter (Signed)
Received signed domestic return receipt verifying delivery of certified letter on March 26, 2017. Article number [id number] [phone] daj

## 2017-05-06 ENCOUNTER — Emergency Department
Admission: EM | Admit: 2017-05-06 | Discharge: 2017-05-06 | Disposition: A | Discharge status: Home / Self Care | Payer: Self-pay | Attending: Emergency Medicine | Admitting: Emergency Medicine

## 2017-05-06 ENCOUNTER — Encounter: Payer: Self-pay | Admitting: Emergency Medicine

## 2017-05-06 ENCOUNTER — Emergency Department: Payer: Self-pay

## 2017-05-06 DIAGNOSIS — N939 Abnormal uterine and vaginal bleeding, unspecified: Secondary | ICD-10-CM

## 2017-05-06 DIAGNOSIS — O200 Threatened abortion: Secondary | ICD-10-CM

## 2017-05-06 DIAGNOSIS — R109 Unspecified abdominal pain: Secondary | ICD-10-CM

## 2017-05-06 DIAGNOSIS — Z3A11 11 weeks gestation of pregnancy: Secondary | ICD-10-CM | POA: Insufficient documentation

## 2017-05-06 LAB — COMPREHENSIVE METABOLIC PANEL
ALBUMIN: 3.5 g/dL (ref 3.5–5.0)
ALK PHOS: 76 U/L (ref 38–126)
ALT: 13 U/L — ABNORMAL LOW (ref 14–54)
AST: 17 U/L (ref 15–41)
Anion gap: 8 (ref 5–15)
BILIRUBIN TOTAL: 0.2 mg/dL — AB (ref 0.3–1.2)
BUN: 10 mg/dL (ref 6–20)
CALCIUM: 9.4 mg/dL (ref 8.9–10.3)
CO2: 25 mmol/L (ref 22–32)
Chloride: 103 mmol/L (ref 101–111)
Creatinine, Ser: 0.64 mg/dL (ref 0.44–1.00)
GFR calc Af Amer: >60 mL/min (ref >60)
GFR calc non Af Amer: >60 mL/min (ref >60)
GLUCOSE: 83 mg/dL (ref 65–99)
Potassium: 4.5 mmol/L (ref 3.5–5.1)
Sodium: 136 mmol/L (ref 135–145)
Total Protein: 6.8 g/dL (ref 6.5–8.1)

## 2017-05-06 LAB — CBC WITH DIFFERENTIAL/PLATELET
Basophils Absolute: 0 10*3/uL (ref 0–0.1)
Basophils Relative: 0 %
Eosinophils Absolute: 0.1 10*3/uL (ref 0–0.7)
Eosinophils Relative: 1 %
HEMATOCRIT: 36.6 % (ref 35.0–47.0)
HEMOGLOBIN: 12.2 g/dL (ref 12.0–16.0)
LYMPHS ABS: 1.7 10*3/uL (ref 1.0–3.6)
LYMPHS PCT: 19 %
MCH: 31.5 pg (ref 26.0–34.0)
MCHC: 33.4 g/dL (ref 32.0–36.0)
MCV: 94.2 fL (ref 80.0–100.0)
MONOS PCT: 7 %
Monocytes Absolute: 0.6 10*3/uL (ref 0.2–0.9)
NEUTROS ABS: 6.4 10*3/uL (ref 1.4–6.5)
NEUTROS PCT: 73 %
Platelets: 306 10*3/uL (ref 150–440)
RBC: 3.89 MIL/uL (ref 3.80–5.20)
RDW: 13.2 % (ref 11.5–14.5)
WBC: 8.8 10*3/uL (ref 3.6–11.0)

## 2017-05-06 LAB — HCG, QUANTITATIVE, PREGNANCY: hCG, Beta Chain, Quant, S: 75788 m[IU]/mL — ABNORMAL HIGH (ref <5)

## 2017-05-06 LAB — POCT PREGNANCY, URINE: Preg Test, Ur: POSITIVE — AB

## 2017-05-06 NOTE — ED Triage Notes (Signed)
Pt comes into the Ed via POV c/o vaginal bleeding that started about 2 weeks ago.  Patient states that she has been bleeding every 4 days and its a heavy bleed.  Patient believes she is around [redacted] weeks pregnant.  Patient states she has abdominal cramping when the bleeding is occurring.  Patient in NAD at this time and states that she hasn't had the bleeding today but had spotting yesterday.  Denies having an OB established at this time

## 2017-05-06 NOTE — ED Provider Notes (Signed)
[facility] Emergency Department Provider Note       Time seen: ----------------------------------------- 4:16 PM on 05/06/2017 -----------------------------------------    I have reviewed the triage vital signs and the nursing notes.  HISTORY   Chief Complaint Vaginal Bleeding    HPI Veronica Mccormick is a 36 y.o. female with a history of depression and pregnancy who presents to the ED for vaginal bleeding that started about 2 weeks ago.  Patient states she has been bleeding every 4 days and bleeding heavily.  She believes she is around [redacted] weeks pregnant, states she has had abdominal cramping with the bleeding is occurring.  She has not had any bleeding today but had spotting yesterday.  Discomfort is 4 out of 10 in the abdomen.  Past Medical History:  Diagnosis Date  . Anemia   . Depression    Postpartum after fetal demise  . History of preterm delivery, currently pregnant 04/26/2013   17p > funneling to cerclage on 12/19 - repeat US in 2 weeks   . Medical history non-contributory   . Vaginal delivery 1996, [id number],2004,2010    Patient Active Problem List   Diagnosis Date Noted  . Depression 07/28/2015  . New onset seizure (HCC) 07/24/2015  . NSVD (normal spontaneous vaginal delivery) 10/04/2013  . Diabetes mellitus complicating pregnancy in third trimester, antepartum 08/20/2013  . Threatened premature labor 08/20/2013  . 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 History of preterm delivery, currently pregnant 04/26/2013  . Pregnancy with history of pre-term labor 04/06/2013  . Pregnancy with other poor obstetric history(V23.49) 04/06/2013    Past Surgical History:  Procedure Laterality Date  . BREAST SURGERY Right    Cyst Removal  . CERCLAGE CERVICAL N/A 05/22/2013   Performed by Adam PhenixArnold, James G, [facility]    Allergies Patient has no known allergies.  Social History Social History   Tobacco Use   . Smoking status: Never Smoker  . Smokeless tobacco: Never Used  Substance Use Topics  . Alcohol use: No    Alcohol/week: 0.0 oz  . Drug use: No    Review of Systems Constitutional: Negative for fever. Cardiovascular: Negative for chest pain. Respiratory: Negative for shortness of breath. Gastrointestinal: Positive for abdominal pain Genitourinary: Positive for vaginal bleeding Musculoskeletal: Negative for back pain. Skin: Negative for rash. Neurological: Negative for headaches, focal weakness or numbness.  All systems negative/normal/unremarkable except as stated in the HPI  ____________________________________________   PHYSICAL EXAM:  VITAL SIGNS: ED Triage Vitals [05/06/17 1429]  Enc Vitals Group     BP 119/67     Pulse Rate 87     Resp 15     Temp 98.3 F (36.8 C)     Temp Source Oral     SpO2 100 %     Weight 145 lb (65.8 kg)     Height 5\' 1"  (1.549 m)     Head Circumference      Peak Flow      Pain Score 4     Pain Loc      Pain Edu?      Excl. in GC?     Constitutional: Alert and oriented. Well appearing and in no distress. Eyes: Conjunctivae are normal. Normal extraocular movements. ENT   Head: Normocephalic and atraumatic.   Nose: No congestion/rhinnorhea.   Mouth/Throat: Mucous membranes are moist.   Neck: No stridor. Cardiovascular: Normal rate, regular rhythm. No murmurs, rubs, or gallops. Respiratory: Normal respiratory effort  without tachypnea nor retractions. Breath sounds are clear and equal bilaterally. No wheezes/rales/rhonchi. Gastrointestinal: Soft and nontender. Normal bowel sounds Musculoskeletal: Nontender with normal range of motion in extremities. No lower extremity tenderness nor edema. Neurologic:  Normal speech and language. No gross focal neurologic deficits are appreciated.  Skin:  Skin is warm, dry and intact. No rash noted. Psychiatric: Mood and affect are normal. Speech and behavior are normal.   ____________________________________________  ED COURSE:  Pertinent labs & imaging results that were available during my care of the patient were reviewed by me and considered in my medical decision making (see chart for details). Patient presents for abdominal pain and bleeding in pregnancy, we will assess with labs and imaging as indicated.   Procedures ____________________________________________   LABS (pertinent positives/negatives)  Labs Reviewed  HCG, QUANTITATIVE, PREGNANCY - Abnormal; Notable for the following components:      Result Value   hCG, Beta Chain, Quant, S 75,788 (*)    All other components within normal limits  POCT PREGNANCY, URINE - Abnormal; Notable for the following components:   Preg Test, Ur POSITIVE (*)    All other components within normal limits  POC URINE PREG, ED    RADIOLOGY  Pregnancy ultrasound IMPRESSION: 1. Single living intrauterine pregnancy measuring 11 weeks 3 days. 2. 8 mm subchorionic hemorrhage. ____________________________________________  DIFFERENTIAL DIAGNOSIS   Miscarriage, ectopic, normal pregnancy  FINAL ASSESSMENT AND PLAN  Threatened miscarriage   Plan: Patient had presented for vaginal bleeding in pregnancy. Patient's labs are reassuring. Patient's imaging reveals a subchorionic hemorrhage but otherwise normal intrauterine pregnancy.  This represents a threatened miscarriage but otherwise she is stable for outpatient follow-up.   Emily FilbertWilliams, Korrie Hofbauer E, MD   Note: This note was generated in part or whole with voice recognition software. Voice recognition is usually quite accurate but there are transcription errors that can and very often do occur. I apologize for any typographical errors that were not detected and corrected.     Emily FilbertWilliams, Mamta Rimmer E, MD 05/06/17 [phone]

## 2017-05-10 ENCOUNTER — Telehealth: Payer: Self-pay | Admitting: General Practice

## 2017-05-10 NOTE — Telephone Encounter (Signed)
Called and left message on VM (with interpreter) for patient to give our office a call back to start prenatal care.

## 2019-01-23 ENCOUNTER — Other Ambulatory Visit: Payer: Self-pay

## 2019-01-23 DIAGNOSIS — Z20822 Contact with and (suspected) exposure to covid-19: Secondary | ICD-10-CM

## 2019-01-24 LAB — NOVEL CORONAVIRUS, NAA: SARS-CoV-2, NAA: DETECTED — AB

## 2019-01-25 ENCOUNTER — Telehealth: Payer: Self-pay | Admitting: *Deleted

## 2019-01-25 NOTE — Telephone Encounter (Signed)
  Pt returned call for covid results,reviewed positive covid 19 results. Reports asymptomatic. Reviewed quarantine precautions; self isolate for 10 days from onset of symptoms, with 3 consecutive days fever free without fever reducing medications. Leave home for medical issues only; if must go out wear mask. Reviewed household precautions and preventive care measures, including: frequent hand-washing, wiping down of high touch areas ie: doorknobs, counter tops. Avoid touching your face, and isolate, distance from rest of household. Reviewed symptoms which warrant an ED visit. Pt verbalizes understanding. [facility] notified noted yesterday, see previous note.  ------

## 2022-06-10 NOTE — Progress Notes (Signed)
 Erroneous encounter   Electronically signed by: Arelia Filippo, MD 06/10/22 1547

## 2023-02-08 NOTE — Progress Notes (Signed)
 PLASTIC SURGERY CONSULT NOTE  REASON FOR CONSULT: Abdominal Lipodystrophy  HPI: Veronica Mccormick is a 42 y.o. female presenting due to primary complaint of abdominal lipodystrophy.  She currently weighs 68.9 kg (152 lb) and this has been stable. She has not gained or lost a significant amount of weight in the last few years.  Hx of DVT/PE: no She has never had any abdominal surgeries Nicotine Use: None  REVIEW OF SYSTEMS Pertinent items are noted in HPI.  History reviewed. No pertinent past medical history.   History reviewed. No pertinent surgical history.   No current outpatient medications on file.   No current facility-administered medications for this visit.    No Known Allergies   Social History   Socioeconomic History  . Marital status: Single    Spouse name: Not on file  . Number of children: Not on file  . Years of education: Not on file  . Highest education level: Not on file  Occupational History  . Not on file  Tobacco Use  . Smoking status: Never  . Smokeless tobacco: Never  Substance and Sexual Activity  . Alcohol  use: No  . Drug use: No  . Sexual activity: Not on file  Other Topics Concern  . Not on file  Social History Narrative   ** Merged History Encounter **       Social Determinants of Health   Food Insecurity: Not on file  Transportation Needs: Not on file  Safety: Not on file  Living Situation: Not on file     No family history on file.    OBJECTIVE: BP (!) 121/91   Pulse 100   Temp 98.3 F (36.8 C) (Temporal)   Ht 1.575 m (5' 2)   Wt 68.9 kg (152 lb)   LMP 02/08/2023   BMI 27.80 kg/m   PHYSICAL EXAM: GEN: No acute distress, alert and oriented. HEENT: Normocephalic, anicteric sclera.EOMI/PERRLA CV: Regular Rate and Rhythm. RESP: Stable on Room Air. Non-labored breathing. EXTR: Peripheral pulses intact  ABDOMEN:  Moderate amount of abdominal lipodystrophy with small overhanging pannus. Subcutaneous adipose layer  approximately 1-1.5inches thick Diastasis: supraumbilical diastasis Striae: None, good skin quality Abdominal Scars: None Palpable Hernia: None     Impression / Plan: Veronica Mccormick presents to discuss abdominal cosmetic contour complaints.   We discussed improvement in complaints with lipo-abdominoplasty.  We discussed in great detail the risks, benefits, alternatives, scarring, and recovery time. Specific risks detailed included bleeding, infection, hematoma, seroma, hypertrophic or keloid scarring, persistent pain or abdominal discomfort, wound healing problems and need for dressing changes, skin loss, fat necrosis,  umbilical necrosis, need for urgent reoperation, abdominal contour asymmetries, and need for further surgery to improve postoperative contour  were discussed. Additionally we discussed the risk of DVT/PE associated with this procedure.   Finally, the patient was counseled that any complications arising from the procedure, including any laboratory work, imaging studies, hospital admission, or reoperations would be the financial responsibility of the patient.   Photos were taken today.   Will get her cost quote  Electronically signed by:  Delinda Prentice Bullocks, MD 02/08/2023 3:57 PM   Risks discussed:  Risks include but are not limited to:  Anesthesia risks Bleeding Infection Fluid accumulation (seroma) Poor wound healing Skin loss (loss of navel) Numbness or other changes in skin sensation Skin discoloration and/or prolonged swelling Unfavorable scarring Recurrent looseness of skin Fatty tissue found deep in the skin might die (fat necrosis) Deep vein thrombosis, cardiac and pulmonary complications Asymmetry  Suboptimal aesthetic result Possibility of revisional surgery Persistent pain     Written information provided    I personally saw and examined the patient in clinic today, and agree with the history, exam, assessment and plan as documented by  the resident.

## 2024-02-11 ENCOUNTER — Other Ambulatory Visit: Payer: Self-pay

## 2024-02-11 ENCOUNTER — Emergency Department: Payer: Self-pay

## 2024-02-11 ENCOUNTER — Emergency Department
Admission: EM | Admit: 2024-02-11 | Discharge: 2024-02-11 | Disposition: A | Discharge status: Home / Self Care | Payer: Self-pay | Attending: Emergency Medicine | Admitting: Emergency Medicine

## 2024-02-11 DIAGNOSIS — R55 Syncope and collapse: Secondary | ICD-10-CM | POA: Insufficient documentation

## 2024-02-11 DIAGNOSIS — R569 Unspecified convulsions: Secondary | ICD-10-CM

## 2024-02-11 DIAGNOSIS — K0889 Other specified disorders of teeth and supporting structures: Secondary | ICD-10-CM | POA: Insufficient documentation

## 2024-02-11 HISTORY — DX: Unspecified convulsions: R56.9

## 2024-02-11 LAB — CBG MONITORING, ED
Glucose-Capillary: 51 mg/dL — ABNORMAL LOW (ref 70–99)
Glucose-Capillary: 64 mg/dL — ABNORMAL LOW (ref 70–99)
Glucose-Capillary: 110 mg/dL — ABNORMAL HIGH (ref 70–99)

## 2024-02-11 LAB — CBC
HCT: 40.7 % (ref 36.0–46.0)
Hemoglobin: 13.4 g/dL (ref 12.0–15.0)
MCH: 30.6 pg (ref 26.0–34.0)
MCHC: 32.9 g/dL (ref 30.0–36.0)
MCV: 92.9 fL (ref 80.0–100.0)
Platelets: 391 K/uL (ref 150–400)
RBC: 4.38 MIL/uL (ref 3.87–5.11)
RDW: 12.6 % (ref 11.5–15.5)
WBC: 7.3 K/uL (ref 4.0–10.5)
nRBC: 0 % (ref 0.0–0.2)

## 2024-02-11 LAB — BASIC METABOLIC PANEL WITH GFR
Anion gap: 12 (ref 5–15)
BUN: 10 mg/dL (ref 6–20)
CO2: 27 mmol/L (ref 22–32)
Calcium: 9.1 mg/dL (ref 8.9–10.3)
Chloride: 99 mmol/L (ref 98–111)
Creatinine, Ser: 0.79 mg/dL (ref 0.44–1.00)
GFR, Estimated: >60 mL/min (ref >60)
Glucose, Bld: 69 mg/dL — ABNORMAL LOW (ref 70–99)
Potassium: 3.6 mmol/L (ref 3.5–5.1)
Sodium: 138 mmol/L (ref 135–145)

## 2024-02-11 LAB — MAGNESIUM: Magnesium: 2.3 mg/dL (ref 1.7–2.4)

## 2024-02-11 MED ORDER — HYDROCODONE-ACETAMINOPHEN 5-325 MG PO TABS: 1.0000 | ORAL_TABLET | Freq: Four times a day (QID) | ORAL | 0 refills | Status: AC | PRN

## 2024-02-11 MED ORDER — AMOXICILLIN-POT CLAVULANATE 875-125 MG PO TABS: 1.0000 | ORAL_TABLET | Freq: Two times a day (BID) | ORAL | 0 refills | Status: AC

## 2024-02-11 MED ORDER — AMOXICILLIN-POT CLAVULANATE 875-125 MG PO TABS
1.0000 | ORAL_TABLET | Freq: Once | ORAL | Status: AC
  Administered 2024-02-11: 1 via ORAL
  Filled 2024-02-11: qty 1

## 2024-02-11 MED ORDER — HYDROCODONE-ACETAMINOPHEN 5-325 MG PO TABS
1.0000 | ORAL_TABLET | Freq: Once | ORAL | Status: AC
  Administered 2024-02-11: 1 via ORAL
  Filled 2024-02-11: qty 1

## 2024-02-11 NOTE — Discharge Instructions (Addendum)
 Tome el antibitico segn lo recetado hasta su cita de seguimiento con el dentista. Puede tomar hidrocodona para el dolor, o Tylenol  o ibuprofeno.  Le proporcionamos una lista de dentistas locales para que pueda Magazine features editor.  Tambin debe programar una cita de seguimiento con el neurlogo.  Segn la ley estatal de [place], no se le permite conducir durante 6 meses despus de una convulsin.  Regrese a urgencias si presenta convulsiones nuevas, que empeoran o persisten, si se desmaya, presenta debilidad o cualquier otro sntoma nuevo o que empeora que le preocupe.

## 2024-02-11 NOTE — ED Triage Notes (Signed)
 To ED [facility] for generalized seizure lasting 3-5 minutes today. Pt was postictal on scene, now oriented X4. Has R tongue lac. Urinated on self. No meds given en route. 20# to R AC.   EMS VS: 141/89, 100% RA, CBG 73, has not eaten today. HR 110.   Hx seizures, also some sort of brain tumor diagnosed last year. Pt states she does not take any meds for seizures. Pt states last seizure before this was about 1 year ago.  Pt has been on amox and tramadol for R upper dental pain.   CBG 51. Pt is alert and oriented. Skin is dry. Providing orange juice and called first nurse.   EMS IV is infiltrated--removed and placing new.

## 2024-02-11 NOTE — ED Provider Notes (Signed)
 [facility] Provider Note    Event Date/Time   First MD Initiated Contact with Patient 02/11/24 1949     (approximate)   History   Seizures   HPI  Patient declined Spanish interpreter  Veronica Mccormick is a 43 y.o. female with a history of a seizure disorder, not on any medication, who presents with an apparent seizure, acute onset this evening, lasting a few minutes, witnessed by family.  The patient did bite her tongue and have incontinence.  She does not remember what happened.  She states that she has some pain where she bit her tongue but denies other acute complaints.  She states that her last seizure was about a year ago.  She is not sure why she is not on any medication for it.  She last saw neurology several years ago.  I reviewed the past medical records.  The patient was evaluated by neurology and had an EEG in 2018 for new onset seizure.  The EEG was normal.  I do not see any record of brain imaging since 2017.  Triage noted that the patient reported history of some kind of brain tumor although to me she states that she had an injection in her neck for pain and denies history of a tumor.   Physical Exam   Triage Vital Signs: ED Triage Vitals  Encounter Vitals Group     BP 02/11/24 1757 (!) 131/92     Girls Systolic BP Percentile --      Girls Diastolic BP Percentile --      Boys Systolic BP Percentile --      Boys Diastolic BP Percentile --      Pulse Rate 02/11/24 1757 (!) 118     Resp 02/11/24 1757 20     Temp 02/11/24 1757 98.5 F (36.9 C)     Temp Source 02/11/24 1757 Oral     SpO2 02/11/24 1757 100 %     Weight 02/11/24 1801 162 lb (73.5 kg)     Height 02/11/24 1801 5' 3 (1.6 m)     Head Circumference --      Peak Flow --      Pain Score 02/11/24 1757 7     Pain Loc --      Pain Education --      Exclude from Growth Chart --     Most recent vital signs: Vitals:   02/11/24 2145 02/11/24 2155  BP:    Pulse: 100    Resp: 20   Temp:  98.6 F (37 C)  SpO2: 97%     General:  Alert, comfortable appearing, no distress.  CV:  Good peripheral perfusion.  Resp:  Normal effort.  Abd:  No distention.  Other:  EOMI.  PERRLA.  No photophobia.  No facial droop.  Normal speech.  Motor intact in all extremities.  No ataxia.     ED Results / Procedures / Treatments   Labs (all labs ordered are listed, but only abnormal results are displayed) Labs Reviewed  BASIC METABOLIC PANEL WITH GFR - Abnormal; Notable for the following components:      Result Value   Glucose, Bld 69 (*)    All other components within normal limits  CBG MONITORING, ED - Abnormal; Notable for the following components:   Glucose-Capillary 51 (*)    All other components within normal limits  CBG MONITORING, ED - Abnormal; Notable for the following components:   Glucose-Capillary 64 (*)  All other components within normal limits  CBG MONITORING, ED - Abnormal; Notable for the following components:   Glucose-Capillary 110 (*)    All other components within normal limits  CBC  MAGNESIUM  POC URINE PREG, ED     EKG  ED ECG REPORT I, Waylon Cassis, the attending physician, personally viewed and interpreted this ECG.  Date: 02/11/2024 EKG Time: 2051 Rate: 103 Rhythm: Sinus tachycardia QRS Axis: normal Intervals: Prolonged QTc ST/T Wave abnormalities: normal Narrative Interpretation: no evidence of acute ischemia    RADIOLOGY  CT head: I independently viewed and interpreted the images; there is no ICH or other acute abnormality   PROCEDURES:  Critical Care performed: No  Procedures   MEDICATIONS ORDERED IN ED: Medications  HYDROcodone -acetaminophen  (NORCO/VICODIN) 5-325 MG per tablet 1 tablet (1 tablet Oral Given 02/11/24 2152)  amoxicillin -clavulanate (AUGMENTIN ) 875-125 MG per tablet 1 tablet (1 tablet Oral Given 02/11/24 2152)     IMPRESSION / MDM / ASSESSMENT AND PLAN / ED COURSE  I reviewed the  triage vital signs and the nursing notes.  43 year old female with PMH as noted above including a known seizure disorder presents with a breakthrough seizure.  She is not on any medication for it currently.  On exam her vital signs are normal except for tachycardia at triage which has resolved.  Neurologic exam is nonfocal.  Differential diagnosis includes, but is not limited to, epileptic seizure, pseudoseizure, syncope.  Presentation favors true epileptic seizure especially given the tongue biting and incontinence.  We will obtain basic labs.  I have ordered a CT given the questionable history of a possible brain lesion.  Patient's presentation is most consistent with acute complicated illness / injury requiring diagnostic workup.  ----------------------------------------- 9:45 PM on 02/11/2024 -----------------------------------------  CT head is negative.  BMP and CBC showed no acute abnormality.  EKG shows a possible prolonged QT although the QT is not longer than the RR interval.  I checked a magnesium which is normal.  On reassessment, the patient is comfortable.  She does now also report dental pain.  She has one upper tooth on the right which is tender with some surrounding erythema to the gums.  This is consistent with a pulpitis or mild dental infection.  I have prescribed Augmentin  and given her a list of dental resources.  I have also given her referral to outpatient neurology.  At this time, she is stable for discharge home.  Return precautions have been provided and she expressed understanding.  FINAL CLINICAL IMPRESSION(S) / ED DIAGNOSES   Final diagnoses:  Seizure (HCC)  Toothache     Rx / DC Orders   ED Discharge Orders          Ordered    amoxicillin -clavulanate (AUGMENTIN ) 875-125 MG tablet  2 times daily        02/11/24 2141    HYDROcodone -acetaminophen  (NORCO/VICODIN) 5-325 MG tablet  Every 6 hours PRN        02/11/24 2141             Note:  This  document was prepared using Dragon voice recognition software and may include unintentional dictation errors.    Cassis Waylon, MD 02/11/24 [phone]

## 2024-02-11 NOTE — ED Notes (Signed)
 Fall risk bundle is currently in place.
